# Patient Record
Sex: Female | Born: 1965 | Race: White | Hispanic: No | Smoking: Current every day smoker
Health system: Southern US, Community
[De-identification: ages and names within clinical notes are randomized; demographics above are authoritative.]

## PROBLEM LIST (undated history)

## (undated) DIAGNOSIS — G43809 Other migraine, not intractable, without status migrainosus: Secondary | ICD-10-CM

## (undated) DIAGNOSIS — G43109 Migraine with aura, not intractable, without status migrainosus: Secondary | ICD-10-CM

## (undated) DIAGNOSIS — M542 Cervicalgia: Secondary | ICD-10-CM

## (undated) DIAGNOSIS — M2550 Pain in unspecified joint: Secondary | ICD-10-CM

## (undated) DIAGNOSIS — K5730 Diverticulosis of large intestine without perforation or abscess without bleeding: Secondary | ICD-10-CM

## (undated) DIAGNOSIS — Z87442 Personal history of urinary calculi: Secondary | ICD-10-CM

## (undated) DIAGNOSIS — G8929 Other chronic pain: Secondary | ICD-10-CM

## (undated) DIAGNOSIS — G43909 Migraine, unspecified, not intractable, without status migrainosus: Secondary | ICD-10-CM

## (undated) DIAGNOSIS — M549 Dorsalgia, unspecified: Secondary | ICD-10-CM

## (undated) HISTORY — PX: SHOULDER SURGERY: SHX246

## (undated) HISTORY — PX: BACK SURGERY: SHX140

## (undated) HISTORY — PX: ABDOMINAL HYSTERECTOMY: SHX81

---

## 2002-06-02 ENCOUNTER — Encounter: Admission: RE | Admit: 2002-06-02 | Discharge: 2002-06-02 | Payer: Self-pay | Admitting: Neurosurgery

## 2002-06-02 ENCOUNTER — Encounter: Payer: Self-pay | Admitting: Neurosurgery

## 2002-07-03 ENCOUNTER — Encounter: Payer: Self-pay | Admitting: Neurosurgery

## 2002-07-03 ENCOUNTER — Inpatient Hospital Stay (HOSPITAL_COMMUNITY): Admission: RE | Admit: 2002-07-03 | Discharge: 2002-07-09 | Payer: Self-pay | Admitting: Neurosurgery

## 2002-07-06 ENCOUNTER — Encounter: Payer: Self-pay | Admitting: Neurosurgery

## 2002-10-20 ENCOUNTER — Encounter: Admission: RE | Admit: 2002-10-20 | Discharge: 2002-10-20 | Payer: Self-pay | Admitting: Neurosurgery

## 2002-10-20 ENCOUNTER — Encounter: Payer: Self-pay | Admitting: Neurosurgery

## 2002-12-11 ENCOUNTER — Encounter: Admission: RE | Admit: 2002-12-11 | Discharge: 2002-12-11 | Payer: Self-pay | Admitting: Neurosurgery

## 2003-10-13 DIAGNOSIS — M542 Cervicalgia: Secondary | ICD-10-CM | POA: Insufficient documentation

## 2003-10-13 DIAGNOSIS — Z981 Arthrodesis status: Secondary | ICD-10-CM | POA: Insufficient documentation

## 2003-10-22 ENCOUNTER — Emergency Department: Payer: Self-pay | Admitting: Unknown Physician Specialty

## 2004-01-20 ENCOUNTER — Emergency Department: Payer: Self-pay | Admitting: Emergency Medicine

## 2004-08-04 ENCOUNTER — Emergency Department: Payer: Self-pay | Admitting: General Practice

## 2004-11-14 ENCOUNTER — Emergency Department: Payer: Self-pay | Admitting: Emergency Medicine

## 2004-11-14 ENCOUNTER — Other Ambulatory Visit: Payer: Self-pay

## 2004-11-15 ENCOUNTER — Ambulatory Visit: Payer: Self-pay | Admitting: Emergency Medicine

## 2004-11-16 ENCOUNTER — Emergency Department: Payer: Self-pay | Admitting: Emergency Medicine

## 2005-06-01 ENCOUNTER — Emergency Department: Payer: Self-pay | Admitting: Emergency Medicine

## 2005-08-28 ENCOUNTER — Emergency Department: Payer: Self-pay | Admitting: Emergency Medicine

## 2005-08-28 ENCOUNTER — Other Ambulatory Visit: Payer: Self-pay

## 2006-12-08 ENCOUNTER — Emergency Department: Payer: Self-pay | Admitting: Emergency Medicine

## 2007-02-12 ENCOUNTER — Ambulatory Visit: Payer: Self-pay | Admitting: Pain Medicine

## 2007-03-11 ENCOUNTER — Encounter: Payer: Self-pay | Admitting: Internal Medicine

## 2007-03-26 ENCOUNTER — Ambulatory Visit: Payer: Self-pay | Admitting: Pain Medicine

## 2007-04-02 ENCOUNTER — Ambulatory Visit: Payer: Self-pay | Admitting: Pain Medicine

## 2007-04-07 ENCOUNTER — Ambulatory Visit: Payer: Self-pay | Admitting: Pain Medicine

## 2007-04-08 ENCOUNTER — Ambulatory Visit: Payer: Self-pay | Admitting: Pain Medicine

## 2007-04-23 ENCOUNTER — Ambulatory Visit: Payer: Self-pay | Admitting: Pain Medicine

## 2007-05-21 ENCOUNTER — Ambulatory Visit: Payer: Self-pay | Admitting: Pain Medicine

## 2007-06-25 ENCOUNTER — Ambulatory Visit: Payer: Self-pay | Admitting: Physician Assistant

## 2007-07-28 ENCOUNTER — Ambulatory Visit: Payer: Self-pay | Admitting: Pain Medicine

## 2007-07-28 ENCOUNTER — Emergency Department: Payer: Self-pay | Admitting: Emergency Medicine

## 2007-08-04 ENCOUNTER — Ambulatory Visit: Payer: Self-pay | Admitting: Cardiovascular Disease

## 2007-08-14 ENCOUNTER — Ambulatory Visit: Payer: Self-pay | Admitting: Internal Medicine

## 2007-09-02 ENCOUNTER — Ambulatory Visit: Payer: Self-pay | Admitting: Physician Assistant

## 2007-09-18 ENCOUNTER — Encounter: Payer: Self-pay | Admitting: Pain Medicine

## 2007-10-02 ENCOUNTER — Ambulatory Visit: Payer: Self-pay | Admitting: Physician Assistant

## 2007-12-23 ENCOUNTER — Ambulatory Visit: Payer: Self-pay | Admitting: Pain Medicine

## 2008-02-25 ENCOUNTER — Ambulatory Visit: Payer: Self-pay | Admitting: Physician Assistant

## 2010-02-20 ENCOUNTER — Emergency Department: Payer: Self-pay | Admitting: Emergency Medicine

## 2011-08-21 ENCOUNTER — Ambulatory Visit: Payer: Self-pay | Admitting: Internal Medicine

## 2012-01-19 ENCOUNTER — Emergency Department: Payer: Self-pay | Admitting: Emergency Medicine

## 2012-01-22 DIAGNOSIS — IMO0002 Reserved for concepts with insufficient information to code with codable children: Secondary | ICD-10-CM | POA: Insufficient documentation

## 2012-07-15 ENCOUNTER — Ambulatory Visit: Payer: Self-pay | Admitting: Internal Medicine

## 2012-08-15 ENCOUNTER — Emergency Department: Payer: Self-pay | Admitting: Emergency Medicine

## 2012-08-18 ENCOUNTER — Emergency Department: Payer: Self-pay | Admitting: Emergency Medicine

## 2012-11-03 ENCOUNTER — Ambulatory Visit: Payer: Self-pay | Admitting: Otolaryngology

## 2012-12-18 ENCOUNTER — Ambulatory Visit: Payer: Self-pay | Admitting: Orthopedic Surgery

## 2013-10-27 ENCOUNTER — Emergency Department: Payer: Self-pay | Admitting: Emergency Medicine

## 2013-10-27 LAB — CBC
HCT: 41.5 % (ref 35.0–47.0)
HGB: 13.8 g/dL (ref 12.0–16.0)
MCH: 31.9 pg (ref 26.0–34.0)
MCHC: 33.2 g/dL (ref 32.0–36.0)
MCV: 96 fL (ref 80–100)
Platelet: 242 10*3/uL (ref 150–440)
RBC: 4.32 10*6/uL (ref 3.80–5.20)
RDW: 12.7 % (ref 11.5–14.5)
WBC: 7.8 10*3/uL (ref 3.6–11.0)

## 2013-10-27 LAB — BASIC METABOLIC PANEL
Anion Gap: 7 (ref 7–16)
BUN: 9 mg/dL (ref 7–18)
CALCIUM: 8.4 mg/dL — AB (ref 8.5–10.1)
Chloride: 111 mmol/L — ABNORMAL HIGH (ref 98–107)
Co2: 23 mmol/L (ref 21–32)
Creatinine: 0.96 mg/dL (ref 0.60–1.30)
EGFR (Non-African Amer.): >60
Glucose: 125 mg/dL — ABNORMAL HIGH (ref 65–99)
Osmolality: 281 (ref 275–301)
POTASSIUM: 4 mmol/L (ref 3.5–5.1)
Sodium: 141 mmol/L (ref 136–145)

## 2013-10-27 LAB — TROPONIN I: Troponin-I: <0.02 ng/mL

## 2014-09-14 ENCOUNTER — Emergency Department
Admission: EM | Admit: 2014-09-14 | Discharge: 2014-09-14 | Disposition: A | Discharge status: Home / Self Care | Payer: Medicare Other | Attending: Emergency Medicine | Admitting: Emergency Medicine

## 2014-09-14 ENCOUNTER — Encounter: Payer: Self-pay | Admitting: Emergency Medicine

## 2014-09-14 DIAGNOSIS — Z79899 Other long term (current) drug therapy: Secondary | ICD-10-CM | POA: Diagnosis not present

## 2014-09-14 DIAGNOSIS — M7918 Myalgia, other site: Secondary | ICD-10-CM

## 2014-09-14 DIAGNOSIS — Z791 Long term (current) use of non-steroidal anti-inflammatories (NSAID): Secondary | ICD-10-CM | POA: Insufficient documentation

## 2014-09-14 DIAGNOSIS — Z720 Tobacco use: Secondary | ICD-10-CM | POA: Insufficient documentation

## 2014-09-14 DIAGNOSIS — M546 Pain in thoracic spine: Secondary | ICD-10-CM | POA: Diagnosis not present

## 2014-09-14 DIAGNOSIS — R109 Unspecified abdominal pain: Secondary | ICD-10-CM | POA: Diagnosis not present

## 2014-09-14 DIAGNOSIS — G8929 Other chronic pain: Secondary | ICD-10-CM | POA: Diagnosis not present

## 2014-09-14 HISTORY — DX: Diverticulosis of large intestine without perforation or abscess without bleeding: K57.30

## 2014-09-14 LAB — URINALYSIS COMPLETE WITH MICROSCOPIC (ARMC ONLY)
Bilirubin Urine: NEGATIVE
Glucose, UA: NEGATIVE mg/dL
HGB URINE DIPSTICK: NEGATIVE
Ketones, ur: NEGATIVE mg/dL
LEUKOCYTES UA: NEGATIVE
Nitrite: NEGATIVE
PROTEIN: NEGATIVE mg/dL
RBC / HPF: NONE SEEN RBC/hpf (ref 0–5)
SPECIFIC GRAVITY, URINE: 1.015 (ref 1.005–1.030)
pH: 6 (ref 5.0–8.0)

## 2014-09-14 LAB — CBC WITH DIFFERENTIAL/PLATELET
BASOS ABS: 0.1 10*3/uL (ref 0–0.1)
BASOS PCT: 1 %
EOS ABS: 0 10*3/uL (ref 0–0.7)
EOS PCT: 0 %
HCT: 42.2 % (ref 35.0–47.0)
Hemoglobin: 14.3 g/dL (ref 12.0–16.0)
Lymphocytes Relative: 46 %
Lymphs Abs: 3 10*3/uL (ref 1.0–3.6)
MCH: 31.8 pg (ref 26.0–34.0)
MCHC: 33.8 g/dL (ref 32.0–36.0)
MCV: 94.3 fL (ref 80.0–100.0)
Monocytes Absolute: 0.4 10*3/uL (ref 0.2–0.9)
Monocytes Relative: 6 %
Neutro Abs: 3.1 10*3/uL (ref 1.4–6.5)
Neutrophils Relative %: 47 %
PLATELETS: 270 10*3/uL (ref 150–440)
RBC: 4.48 MIL/uL (ref 3.80–5.20)
RDW: 12.8 % (ref 11.5–14.5)
WBC: 6.5 10*3/uL (ref 3.6–11.0)

## 2014-09-14 LAB — COMPREHENSIVE METABOLIC PANEL
ALBUMIN: 4.1 g/dL (ref 3.5–5.0)
ALK PHOS: 43 U/L (ref 38–126)
ALT: 9 U/L — AB (ref 14–54)
ANION GAP: 6 (ref 5–15)
AST: 16 U/L (ref 15–41)
BILIRUBIN TOTAL: 0.7 mg/dL (ref 0.3–1.2)
BUN: 14 mg/dL (ref 6–20)
CALCIUM: 9.3 mg/dL (ref 8.9–10.3)
CO2: 24 mmol/L (ref 22–32)
Chloride: 109 mmol/L (ref 101–111)
Creatinine, Ser: 1.02 mg/dL — ABNORMAL HIGH (ref 0.44–1.00)
GFR calc Af Amer: >60 mL/min (ref >60)
GFR calc non Af Amer: >60 mL/min (ref >60)
GLUCOSE: 117 mg/dL — AB (ref 65–99)
Potassium: 3.9 mmol/L (ref 3.5–5.1)
Sodium: 139 mmol/L (ref 135–145)
Total Protein: 6.7 g/dL (ref 6.5–8.1)

## 2014-09-14 LAB — LIPASE, BLOOD: LIPASE: 18 U/L — AB (ref 22–51)

## 2014-09-14 MED ORDER — NAPROXEN 250 MG PO TABS: 250.0000 mg | ORAL_TABLET | Freq: Two times a day (BID) | ORAL | Status: DC

## 2014-09-14 NOTE — ED Notes (Signed)
AAOx3.  Skin warm and dry.  NAD.  Ambulates with easy and steaDY gait.  Posture relaxed.  Moving all extremities.

## 2014-09-14 NOTE — ED Notes (Signed)
AAOx3.  Skin warm and dry.  NAD 

## 2014-09-14 NOTE — ED Provider Notes (Signed)
[facility] Emergency Department Provider Note  ____________________________________________  Time seen: 12:20 PM  I have reviewed the triage vital signs and the nursing notes.   HISTORY  Chief Complaint Abdominal Pain    HPI Olivia Young is a 49 y.o. female who complains of right mid back pain that started at 3:00 AM last night. It woke her up from sleep.  It feels similar to her usual chronic muscular skeletal pains, but was not relieved with her home medications including Valium and Norco. She did note that her autistic daughter was home this weekend and she was helping her with assistance from her chair and with bathing, and the daughter was about 220 pounds so she may have strained and aggravated her back. She has a history of back surgery and bulging disks.  No numbness tingling weakness. No bowel or bladder retention or incontinence. No fevers chills nausea vomiting. No chest pain shortness breath.  States that she is now starting to feel better. She rates the pain as 5 out of 10 currently. It is aching, radiating to the right lower back. No aggravating or alleviating factors. No other associated symptoms. No recent dysuria frequency urgency. No vaginal bleeding or discharge.     Past Medical History  Diagnosis Date  . Diverticula, colon    GERD   There are no active problems to display for this patient.    Past Surgical History  Procedure Laterality Date  . Back surgery    . Shoulder surgery     hysterectomy Cholecystectomy  Current Outpatient Rx  Name  Route  Sig  Dispense  Refill  . amphetamine-dextroamphetamine (ADDERALL) 20 MG tablet   Oral   Take 20 mg by mouth 2 (two) times daily.         Marland Kitchen buPROPion (WELLBUTRIN SR) 200 MG 12 hr tablet   Oral   Take 200 mg by mouth 2 (two) times daily.         . diazepam (VALIUM) 10 MG tablet   Oral   Take 10 mg by mouth 4 (four) times daily.         Marland Kitchen esomeprazole (NEXIUM) 40 MG  capsule   Oral   Take 40 mg by mouth daily at 12 noon.         Marland Kitchen HYDROcodone-acetaminophen (NORCO) 7.5-325 MG per tablet   Oral   Take 1 tablet by mouth every 6 (six) hours as needed for moderate pain.         . ranitidine (ZANTAC) 150 MG tablet   Oral   Take 150 mg by mouth every morning.         . simvastatin (ZOCOR) 20 MG tablet   Oral   Take 20 mg by mouth at bedtime.         . topiramate (TOPAMAX) 50 MG tablet   Oral   Take 50 mg by mouth daily.         Marland Kitchen zolpidem (AMBIEN) 10 MG tablet   Oral   Take 10 mg by mouth at bedtime.         . naproxen (NAPROSYN) 250 MG tablet   Oral   Take 1 tablet (250 mg total) by mouth 2 (two) times daily with a meal.   40 tablet   0      Allergies Codeine   History reviewed. No pertinent family history.  Social History Social History  Substance Use Topics  . Smoking status: Current Every Day Smoker  .  Smokeless tobacco: None  . Alcohol Use: No    Review of Systems  Constitutional:   No fever or chills. No weight changes Eyes:   No blurry vision or double vision.  ENT:   No sore throat. Cardiovascular:   No chest pain. Respiratory:   No dyspnea or cough. Gastrointestinal:   Negative for abdominal pain, vomiting and diarrhea.  No BRBPR or melena. Genitourinary:   Negative for dysuria, urinary retention, bloody urine, or difficulty urinating. Musculoskeletal:   Acute on chronic back pain. No joint pains or swelling. Skin:   Negative for rash. Neurological:   Negative for headaches, focal weakness or numbness. Psychiatric:  No anxiety or depression.   Endocrine:  No hot/cold intolerance, changes in energy, or sleep difficulty.  10-point ROS otherwise negative.  ____________________________________________   PHYSICAL EXAM:  VITAL SIGNS: ED Triage Vitals  Enc Vitals Group     BP 09/14/14 0828 99/58 mmHg     Pulse Rate 09/14/14 0828 103     Resp 09/14/14 0828 20     Temp 09/14/14 0828 98.6 F (37 C)      Temp Source 09/14/14 0828 Oral     SpO2 09/14/14 0828 97 %     Weight 09/14/14 0828 132 lb (59.875 kg)     Height 09/14/14 0828 5\' 5"  (1.651 m)     Head Cir --      Peak Flow --      Pain Score 09/14/14 0828 8     Pain Loc --      Pain Edu? --      Excl. in GC? --      Constitutional:   Alert and oriented. Well appearing and in no distress. Eyes:   No scleral icterus. No conjunctival pallor. PERRL. EOMI ENT   Head:   Normocephalic and atraumatic.   Nose:   No congestion/rhinnorhea. No septal hematoma   Mouth/Throat:   MMM, no pharyngeal erythema. No peritonsillar mass. No uvula shift.   Neck:   No stridor. No SubQ emphysema. No meningismus. Hematological/Lymphatic/Immunilogical:   No cervical lymphadenopathy. Cardiovascular:   RRR. Normal and symmetric distal pulses are present in all extremities. No murmurs, rubs, or gallops. Respiratory:   Normal respiratory effort without tachypnea nor retractions. Breath sounds are clear and equal bilaterally. No wheezes/rales/rhonchi. Gastrointestinal:   Soft and nontender. No distention. There is no CVA tenderness.  No rebound, rigidity, or guarding. Genitourinary:   deferred Musculoskeletal:   Nontender with normal range of motion in all extremities. No joint effusions.  No lower extremity tenderness.  No edema. No midline spinal tenderness. There is tenderness in the paraspinous musculature on the right side just below the inferior ribs. Neurologic:   Normal speech and language.  CN 2-10 normal. Motor grossly intact. No pronator drift.  Normal gait. No gross focal neurologic deficits are appreciated.  Skin:    Skin is warm, dry and intact. No rash noted.  No petechiae, purpura, or bullae. Psychiatric:   Mood and affect are normal. Speech and behavior are normal. Patient exhibits appropriate insight and judgment.  ____________________________________________    LABS (pertinent positives/negatives) (all labs ordered are  listed, but only abnormal results are displayed) Labs Reviewed  COMPREHENSIVE METABOLIC PANEL - Abnormal; Notable for the following:    Glucose, Bld 117 (*)    Creatinine, Ser 1.02 (*)    ALT 9 (*)    All other components within normal limits  URINALYSIS COMPLETEWITH MICROSCOPIC ([facility] ONLY) - Abnormal; Notable for the  following:    Color, Urine YELLOW (*)    APPearance CLEAR (*)    Bacteria, UA RARE (*)    Squamous Epithelial / LPF 0-5 (*)    All other components within normal limits  LIPASE, BLOOD - Abnormal; Notable for the following:    Lipase 18 (*)    All other components within normal limits  CBC WITH DIFFERENTIAL/PLATELET   ____________________________________________   EKG    ____________________________________________    RADIOLOGY    ____________________________________________    PROCEDURES   ____________________________________________   INITIAL IMPRESSION / ASSESSMENT AND PLAN / ED COURSE  Pertinent labs & imaging results that were available during my care of the patient were reviewed by me and considered in my medical decision making (see chart for details).  Patient presents with acute on chronic muscular skeletal back pain. Very well-appearing no acute distress. Low suspicion for AAA or abdominal pathology. Low suspicion for torsion or hemorrhage or infectious pathology. No evidence of pyelonephritis or kidney stone.  Continue current therapy, NSAIDs, heating pad, follow with primary care     ____________________________________________   FINAL CLINICAL IMPRESSION(S) / ED DIAGNOSES  Final diagnoses:  Musculoskeletal pain      Sharman Cheek, MD 09/14/14 1244

## 2014-09-14 NOTE — Discharge Instructions (Signed)

## 2014-09-14 NOTE — ED Notes (Signed)
Pt reports right abd pain and flank pain onset yesterday

## 2015-01-26 ENCOUNTER — Other Ambulatory Visit: Payer: Self-pay | Admitting: Neurology

## 2015-01-26 DIAGNOSIS — R1310 Dysphagia, unspecified: Secondary | ICD-10-CM

## 2015-02-07 DIAGNOSIS — G43809 Other migraine, not intractable, without status migrainosus: Secondary | ICD-10-CM | POA: Insufficient documentation

## 2015-02-07 DIAGNOSIS — G43109 Migraine with aura, not intractable, without status migrainosus: Secondary | ICD-10-CM

## 2015-02-15 ENCOUNTER — Ambulatory Visit: Payer: Medicare Other

## 2015-03-09 ENCOUNTER — Ambulatory Visit: Payer: Medicare Other | Attending: Neurology

## 2015-06-08 ENCOUNTER — Ambulatory Visit
Admission: RE | Admit: 2015-06-08 | Discharge: 2015-06-08 | Disposition: A | Discharge status: Home / Self Care | Payer: Medicare Other | Source: Ambulatory Visit | Attending: Neurology | Admitting: Neurology

## 2015-06-08 DIAGNOSIS — R200 Anesthesia of skin: Secondary | ICD-10-CM | POA: Insufficient documentation

## 2015-06-08 DIAGNOSIS — R9089 Other abnormal findings on diagnostic imaging of central nervous system: Secondary | ICD-10-CM | POA: Diagnosis not present

## 2015-06-08 DIAGNOSIS — R1310 Dysphagia, unspecified: Secondary | ICD-10-CM | POA: Diagnosis present

## 2015-06-08 MED ORDER — GADOBENATE DIMEGLUMINE 529 MG/ML IV SOLN
15.0000 mL | Freq: Once | INTRAVENOUS | Status: AC | PRN
  Administered 2015-06-08: 12 mL via INTRAVENOUS

## 2015-07-18 ENCOUNTER — Other Ambulatory Visit: Payer: Self-pay | Admitting: Internal Medicine

## 2015-07-18 DIAGNOSIS — Z1231 Encounter for screening mammogram for malignant neoplasm of breast: Secondary | ICD-10-CM

## 2015-08-05 ENCOUNTER — Ambulatory Visit: Payer: Medicare Other

## 2015-08-11 ENCOUNTER — Ambulatory Visit
Admission: RE | Admit: 2015-08-11 | Discharge: 2015-08-11 | Disposition: A | Discharge status: Home / Self Care | Payer: Medicare Other | Source: Ambulatory Visit | Attending: Internal Medicine | Admitting: Internal Medicine

## 2015-08-11 ENCOUNTER — Other Ambulatory Visit: Payer: Self-pay | Admitting: Internal Medicine

## 2015-08-11 DIAGNOSIS — Z1231 Encounter for screening mammogram for malignant neoplasm of breast: Secondary | ICD-10-CM | POA: Diagnosis not present

## 2015-08-19 ENCOUNTER — Ambulatory Visit: Payer: Medicare Other

## 2015-11-16 ENCOUNTER — Other Ambulatory Visit: Payer: Self-pay | Admitting: Neurological Surgery

## 2015-11-16 DIAGNOSIS — G8929 Other chronic pain: Secondary | ICD-10-CM

## 2015-11-16 DIAGNOSIS — M5441 Lumbago with sciatica, right side: Principal | ICD-10-CM

## 2015-11-16 DIAGNOSIS — M5010 Cervical disc disorder with radiculopathy, unspecified cervical region: Secondary | ICD-10-CM

## 2015-11-16 DIAGNOSIS — M4802 Spinal stenosis, cervical region: Secondary | ICD-10-CM

## 2015-11-16 DIAGNOSIS — M5442 Lumbago with sciatica, left side: Principal | ICD-10-CM

## 2015-11-29 ENCOUNTER — Ambulatory Visit
Admission: RE | Admit: 2015-11-29 | Discharge: 2015-11-29 | Disposition: A | Discharge status: Home / Self Care | Payer: Medicare Other | Source: Ambulatory Visit | Attending: Neurological Surgery | Admitting: Neurological Surgery

## 2015-11-29 DIAGNOSIS — M5441 Lumbago with sciatica, right side: Secondary | ICD-10-CM | POA: Insufficient documentation

## 2015-11-29 DIAGNOSIS — G8929 Other chronic pain: Secondary | ICD-10-CM | POA: Diagnosis not present

## 2015-11-29 DIAGNOSIS — M47896 Other spondylosis, lumbar region: Secondary | ICD-10-CM | POA: Insufficient documentation

## 2015-11-29 DIAGNOSIS — M4802 Spinal stenosis, cervical region: Secondary | ICD-10-CM | POA: Insufficient documentation

## 2015-11-29 DIAGNOSIS — M50221 Other cervical disc displacement at C4-C5 level: Secondary | ICD-10-CM | POA: Insufficient documentation

## 2015-11-29 DIAGNOSIS — M5136 Other intervertebral disc degeneration, lumbar region: Secondary | ICD-10-CM | POA: Insufficient documentation

## 2015-11-29 DIAGNOSIS — M5010 Cervical disc disorder with radiculopathy, unspecified cervical region: Secondary | ICD-10-CM

## 2015-11-29 DIAGNOSIS — Z9889 Other specified postprocedural states: Secondary | ICD-10-CM | POA: Diagnosis not present

## 2015-11-29 DIAGNOSIS — M5442 Lumbago with sciatica, left side: Principal | ICD-10-CM

## 2015-11-29 DIAGNOSIS — M48061 Spinal stenosis, lumbar region without neurogenic claudication: Secondary | ICD-10-CM | POA: Diagnosis not present

## 2016-01-17 ENCOUNTER — Encounter (INDEPENDENT_AMBULATORY_CARE_PROVIDER_SITE_OTHER): Payer: Self-pay

## 2016-01-17 ENCOUNTER — Ambulatory Visit (INDEPENDENT_AMBULATORY_CARE_PROVIDER_SITE_OTHER): Payer: Medicare Other | Admitting: Vascular Surgery

## 2016-01-17 ENCOUNTER — Encounter (INDEPENDENT_AMBULATORY_CARE_PROVIDER_SITE_OTHER): Payer: Self-pay | Admitting: Vascular Surgery

## 2016-01-17 VITALS — BP 98/60 | HR 86 | Resp 16 | Ht 64.5 in | Wt 129.0 lb

## 2016-01-17 DIAGNOSIS — I83813 Varicose veins of bilateral lower extremities with pain: Secondary | ICD-10-CM | POA: Diagnosis not present

## 2016-01-17 DIAGNOSIS — G8929 Other chronic pain: Secondary | ICD-10-CM | POA: Diagnosis not present

## 2016-01-17 DIAGNOSIS — M5441 Lumbago with sciatica, right side: Secondary | ICD-10-CM

## 2016-01-17 DIAGNOSIS — M79605 Pain in left leg: Secondary | ICD-10-CM

## 2016-01-17 DIAGNOSIS — M545 Low back pain, unspecified: Secondary | ICD-10-CM | POA: Insufficient documentation

## 2016-01-17 DIAGNOSIS — E785 Hyperlipidemia, unspecified: Secondary | ICD-10-CM | POA: Diagnosis not present

## 2016-01-17 DIAGNOSIS — M79604 Pain in right leg: Secondary | ICD-10-CM | POA: Diagnosis not present

## 2016-01-17 DIAGNOSIS — M5442 Lumbago with sciatica, left side: Secondary | ICD-10-CM | POA: Diagnosis not present

## 2016-01-17 DIAGNOSIS — M79609 Pain in unspecified limb: Secondary | ICD-10-CM | POA: Insufficient documentation

## 2016-01-17 DIAGNOSIS — F17210 Nicotine dependence, cigarettes, uncomplicated: Secondary | ICD-10-CM

## 2016-01-17 DIAGNOSIS — F17200 Nicotine dependence, unspecified, uncomplicated: Secondary | ICD-10-CM | POA: Insufficient documentation

## 2016-01-17 NOTE — Assessment & Plan Note (Signed)
Could certainly be contributing to her lower extremity symptoms as well. Has already had back surgery.

## 2016-01-17 NOTE — Assessment & Plan Note (Signed)
We had a discussion for approximately 3-4 minutes regarding the absolute need for smoking cessation due to the deleterious nature of tobacco on the vascular system. We discussed the tobacco use would diminish patency of any intervention, and likely significantly worsen progressio of aneurysmal or atherosclerotic disease. We discussed multiple agents for quitting including replacement therapy or medications to reduce cravings such as Chantix. The patient voices their understanding of the importance of smoking cessation.

## 2016-01-17 NOTE — Assessment & Plan Note (Signed)
See below

## 2016-01-17 NOTE — Assessment & Plan Note (Signed)
Suspect neuropathic pain is contributing, but venous disease may be as well. See treatment plan as below

## 2016-01-17 NOTE — Progress Notes (Signed)
Patient ID: Olivia Young, female   DOB: 1965-04-23, 51 y.o.   MRN: [id number]  Chief Complaint  Patient presents with  . New Evaluation    Varicose veins    HPI Olivia Young is a 51 y.o. female.  I am asked to see the patient by Dr Ellsworth Lennox for evaluation of painful varicose veins.  The patient presents with complaints of symptomatic varicosities of the lower extremities. The patient reports a long standing history of varicosities and they have become painful over time. There was no clear inciting event or causative factor that started the symptoms.  The right leg is more severly affected. The patient elevates the legs for relief. The pain is described as stinging and burning and is associated with numbness and tingling in the lower leg. The symptoms are generally most severe in the evening, particularly when they have been on their feet for long periods of time. Anti-inflammatories and narcotics has been used to try to improve the symptoms with limited success. The patient complains of occasional swelling as an associated symptom. The patient has no previous history of deep venous thrombosis or superficial thrombophlebitis to their knowledge. She does have a history of back surgery and likely has a neuropathic component of her pain is well, but this has not been improved with typical medications for neuropathy.     Past Medical History:  Diagnosis Date  . Diverticula, colon     Past Surgical History:  Procedure Laterality Date  . BACK SURGERY    . SHOULDER SURGERY      Family History  Problem Relation Age of Onset  . Stroke Mother   . Depression Mother   . Heart disease Father   . Breast cancer Neg Hx   No bleeding or clotting disorders  Social History Social History  Substance Use Topics  . Smoking status: Current Every Day Smoker  . Smokeless tobacco: Never Used  . Alcohol use No  No IV drug use  Allergies  Allergen Reactions  . Codeine Nausea Only  .  Acetaminophen-Codeine Nausea Only  . Penicillins Rash    Current Outpatient Prescriptions  Medication Sig Dispense Refill  . albuterol (PROVENTIL HFA;VENTOLIN HFA) 108 (90 Base) MCG/ACT inhaler Inhale into the lungs.    Marland Kitchen amphetamine-dextroamphetamine (ADDERALL) 20 MG tablet Take 20 mg by mouth 2 (two) times daily.    Marland Kitchen buPROPion (WELLBUTRIN SR) 200 MG 12 hr tablet Take 200 mg by mouth 2 (two) times daily.    . diazepam (VALIUM) 10 MG tablet Take 10 mg by mouth 4 (four) times daily.    Marland Kitchen esomeprazole (NEXIUM) 40 MG capsule Take 40 mg by mouth daily at 12 noon.    . fluticasone (FLONASE) 50 MCG/ACT nasal spray Place into the nose.    Marland Kitchen HYDROcodone-acetaminophen (NORCO) 7.5-325 MG per tablet Take 1 tablet by mouth every 6 (six) hours as needed for moderate pain.    . mirtazapine (REMERON) 15 MG tablet take 1/2 tablet by mouth at bedtime for 2 weeks then INCREASE TO 1 TABLET at bedtime if needed    . montelukast (SINGULAIR) 10 MG tablet     . naproxen (NAPROSYN) 250 MG tablet Take 1 tablet (250 mg total) by mouth 2 (two) times daily with a meal. 40 tablet 0  . ranitidine (ZANTAC) 150 MG tablet Take 150 mg by mouth every morning.    . simvastatin (ZOCOR) 20 MG tablet Take 20 mg by mouth at bedtime.    Marland Kitchen  topiramate (TOPAMAX) 50 MG tablet Take 50 mg by mouth daily.    Marland Kitchen. zolpidem (AMBIEN) 10 MG tablet Take 10 mg by mouth at bedtime.     No current facility-administered medications for this visit.       REVIEW OF SYSTEMS (Negative unless checked)  Constitutional: [] Weight loss  [] Fever  [] Chills Cardiac: [] Chest pain   [] Chest pressure   [] Palpitations   [] Shortness of breath when laying flat   [] Shortness of breath at rest   [] Shortness of breath with exertion. Vascular:  [x] Pain in legs with walking   [x] Pain in legs at rest   [] Pain in legs when laying flat   [] Claudication   [] Pain in feet when walking  [] Pain in feet at rest  [] Pain in feet when laying flat   [] History of DVT   [] Phlebitis    [] Swelling in legs   [x] Varicose veins   [] Non-healing ulcers Pulmonary:   [] Uses home oxygen   [] Productive cough   [] Hemoptysis   [] Wheeze  [] COPD   [] Asthma Neurologic:  [] Dizziness  [] Blackouts   [] Seizures   [] History of stroke   [] History of TIA  [] Aphasia   [] Temporary blindness   [] Dysphagia   [] Weakness or numbness in arms   [] Weakness or numbness in legs Musculoskeletal:  [] Arthritis   [] Joint swelling   [] Joint pain   [x] Low back pain Hematologic:  [] Easy bruising  [] Easy bleeding   [] Hypercoagulable state   [] Anemic  [] Hepatitis Gastrointestinal:  [] Blood in stool   [] Vomiting blood  [] Gastroesophageal reflux/heartburn   [] Abdominal pain Genitourinary:  [] Chronic kidney disease   [] Difficult urination  [] Frequent urination  [] Burning with urination   [] Hematuria Skin:  [] Rashes   [] Ulcers   [] Wounds Psychological:  [] History of anxiety   []  History of major depression.    Physical Exam BP 98/60 (BP Location: Right Arm)   Pulse 86   Resp 16   Ht 5' 4.5" (1.638 m)   Wt 129 lb (58.5 kg)   BMI 21.80 kg/m  Gen:  WD/WN, NAD. Appears younger than stated age Head: [place]/AT, No temporalis wasting.  Ear/Nose/Throat: Hearing grossly intact, dentition good Eyes: Sclera non-icteric. Conjunctiva clear Neck: Supple, no nuchal rigidity. Trachea midline Pulmonary:  Good air movement, no use of accessory muscles, respirations not labored.  Cardiac: RRR, No JVD Vascular: Varicosities diffuse and measuring up to 3 mm in the right lower extremity        Varicosities scattered and measuring up to 1-2 mm in the left lower extremity Vessel Right Left  Radial Palpable Palpable  Ulnar Palpable Palpable  Brachial Palpable Palpable  Carotid Palpable, without bruit Palpable, without bruit  Aorta Not palpable N/A  Femoral Palpable Palpable  Popliteal Palpable Palpable  PT Palpable Palpable  DP Palpable Palpable   Gastrointestinal: soft, non-tender/non-distended. No guarding/reflex. No masses,  surgical incisions, or scars. Musculoskeletal: M/S 5/5 throughout.   No LE edema Neurologic: Sensation grossly intact in extremities.  Symmetrical.  Speech is fluent.  Psychiatric: Judgment intact, Mood & affect appropriate for pt's clinical situation. Dermatologic: No rashes or ulcers noted.  No cellulitis or open wounds. Lymph : No Cervical, Axillary, or Inguinal lymphadenopathy.   Radiology No results found.  Labs No results found for this or any previous visit (from the past 2160 hour(s)).  Assessment/Plan:  Tobacco use disorder We had a discussion for approximately 3-4 minutes regarding the absolute need for smoking cessation due to the deleterious nature of tobacco on the vascular system. We discussed the tobacco  use would diminish patency of any intervention, and likely significantly worsen progressio of aneurysmal or atherosclerotic disease. We discussed multiple agents for quitting including replacement therapy or medications to reduce cravings such as Chantix. The patient voices their understanding of the importance of smoking cessation.   Low back pain Could certainly be contributing to her lower extremity symptoms as well. Has already had back surgery.  Hyperlipidemia lipid control important in reducing the progression of atherosclerotic disease. Continue statin therapy   Pain in limb Suspect neuropathic pain is contributing, but venous disease may be as well. See treatment plan as below  Varicose veins of leg with pain, bilateral See below    The patient has symptoms consistent with chronic venous insufficiency. We discussed the natural history and treatment options for venous disease. I recommended the regular use of 20 - 30 mm Hg compression stockings, and prescribed these today. I recommended leg elevation and anti-inflammatories as needed for pain. I have also recommended a complete venous duplex to assess the venous system for reflux or thrombotic issues. This can  be done at the patient's convenience. I will see the patient back in 3 months to assess the response to conservative management, and determine further treatment options.     Festus Barren 01/17/2016, 12:03 PM   This note was created with Dragon medical transcription system.  Any errors from dictation are unintentional.

## 2016-01-17 NOTE — Patient Instructions (Signed)
Venous Stasis or Chronic Venous Insufficiency Chronic venous insufficiency, also called venous stasis, is a condition that affects the veins in the legs. The condition prevents blood from being pumped through these veins effectively. Blood may no longer be pumped effectively from the legs back to the heart. This condition can range from mild to severe. With proper treatment, you should be able to continue with an active life. CAUSES  Chronic venous insufficiency occurs when the vein walls become stretched, weakened, or damaged or when valves within the vein are damaged. Some common causes of this include:  High blood pressure inside the veins (venous hypertension).  Increased blood pressure in the leg veins from long periods of sitting or standing.  A blood clot that blocks blood flow in a vein (deep vein thrombosis).  Inflammation of a superficial vein (phlebitis) that causes a blood clot to form. RISK FACTORS Various things can make you more likely to develop chronic venous insufficiency, including:  Family history of this condition.  Obesity.  Pregnancy.  Sedentary lifestyle.  Smoking.  Jobs requiring long periods of standing or sitting in one place.  Being a certain age. Women in their 40s and 50s and men in their 70s are more likely to develop this condition. SIGNS AND SYMPTOMS  Symptoms may include:   Varicose veins.  Skin breakdown or ulcers.  Reddened or discolored skin on the leg.  Brown, smooth, tight, and painful skin just above the ankle, usually on the inside surface (lipodermatosclerosis).  Swelling. DIAGNOSIS  To diagnose this condition, your health care provider will take a medical history and do a physical exam. The following tests may be ordered to confirm the diagnosis:  Duplex ultrasound-A procedure that produces a picture of a blood vessel and nearby organs and also provides information on blood flow through the blood vessel.  Plethysmography-A  procedure that tests blood flow.  A venogram, or venography-A procedure used to look at the veins using X-ray and dye. TREATMENT The goals of treatment are to help you return to an active life and to minimize pain or disability. Treatment will depend on the severity of the condition. Medical procedures may be needed for severe cases. Treatment options may include:   Use of compression stockings. These can help with symptoms and lower the chances of the problem getting worse, but they do not cure the problem.  Sclerotherapy-A procedure involving an injection of a material that "dissolves" the damaged veins. Other veins in the network of blood vessels take over the function of the damaged veins.  Surgery to remove the vein or cut off blood flow through the vein (vein stripping or laser ablation surgery).  Surgery to repair a valve. HOME CARE INSTRUCTIONS   Wear compression stockings as directed by your health care provider.  Only take over-the-counter or prescription medicines for pain, discomfort, or fever as directed by your health care provider.  Follow up with your health care provider as directed. SEEK MEDICAL CARE IF:   You have redness, swelling, or increasing pain in the affected area.  You see a red streak or line that extends up or down from the affected area.  You have a breakdown or loss of skin in the affected area, even if the breakdown is small.  You have an injury to the affected area. SEEK IMMEDIATE MEDICAL CARE IF:   You have an injury and open wound in the affected area.  Your pain is severe and does not improve with medicine.  You have   sudden numbness or weakness in the foot or ankle below the affected area, or you have trouble moving your foot or ankle.  You have a fever or persistent symptoms for more than 2-3 days.  You have a fever and your symptoms suddenly get worse. MAKE SURE YOU:   Understand these instructions.  Will watch your condition.  Will  get help right away if you are not doing well or get worse. This information is not intended to replace advice given to you by your health care provider. Make sure you discuss any questions you have with your health care provider. Document Released: 04/30/2006 Document Revised: 10/15/2012 Document Reviewed: 09/01/2012 Elsevier Interactive Patient Education  2017 [employer]  

## 2016-01-17 NOTE — Assessment & Plan Note (Signed)
lipid control important in reducing the progression of atherosclerotic disease. Continue statin therapy  

## 2016-02-20 ENCOUNTER — Encounter (INDEPENDENT_AMBULATORY_CARE_PROVIDER_SITE_OTHER): Payer: Medicare Other

## 2016-02-20 ENCOUNTER — Ambulatory Visit (INDEPENDENT_AMBULATORY_CARE_PROVIDER_SITE_OTHER): Payer: Medicare Other | Admitting: Vascular Surgery

## 2016-04-12 ENCOUNTER — Ambulatory Visit (INDEPENDENT_AMBULATORY_CARE_PROVIDER_SITE_OTHER): Payer: Medicare Other

## 2016-04-12 ENCOUNTER — Ambulatory Visit (INDEPENDENT_AMBULATORY_CARE_PROVIDER_SITE_OTHER): Payer: Medicare Other | Admitting: Vascular Surgery

## 2016-04-12 ENCOUNTER — Encounter (INDEPENDENT_AMBULATORY_CARE_PROVIDER_SITE_OTHER): Payer: Self-pay | Admitting: Vascular Surgery

## 2016-04-12 ENCOUNTER — Encounter (INDEPENDENT_AMBULATORY_CARE_PROVIDER_SITE_OTHER): Payer: Self-pay

## 2016-04-12 VITALS — BP 113/68 | HR 93 | Resp 16 | Ht 64.0 in | Wt 130.0 lb

## 2016-04-12 DIAGNOSIS — I872 Venous insufficiency (chronic) (peripheral): Secondary | ICD-10-CM | POA: Diagnosis not present

## 2016-04-12 DIAGNOSIS — I83813 Varicose veins of bilateral lower extremities with pain: Secondary | ICD-10-CM

## 2016-04-12 NOTE — Progress Notes (Signed)
Subjective:    Patient ID: Olivia Young, female    DOB: 15-Sep-1965, 51 y.o.   MRN: [id number] Chief Complaint  Patient presents with  . Re-evaluation    Ultrasound follow up   Patient last seen on 01/27/16 for evaluation of bilateral lower extremity symptomatic varicose veins. The patient relates burning and stinging which worsens steadily throughout the course of the day, particularly with standing. The patient also notes an aching and throbbing pain over the varicosities, particularly with prolonged dependent positions. The patient also notes that during hot weather the symptoms are greatly intensified. Since her initial visit, the patient has been wearing medical grade one compression, elevating her legs and remaining active with minimal relief in symptoms requiring the use of OTC anti-inflammatories. The patient states the pain from the varicose veins interferes with work, daily exercise, shopping and household maintenance. At this point, the symptoms are persistent and severe enough that they're having a negative impact on lifestyle and are interfering with daily activities. The patient underwent a bilateral venous duplex which was notable for venous incompetence of the bilateral great saphenous veins. No DVT or SVT.   Review of Systems  Constitutional: Negative.   HENT: Negative.   Eyes: Negative.   Respiratory: Negative.   Cardiovascular: Positive for leg swelling.       Painful varicose veins.  Gastrointestinal: Negative.   Endocrine: Negative.   Genitourinary: Negative.   Musculoskeletal: Negative.   Skin: Negative.   Allergic/Immunologic: Negative.   Neurological: Negative.   Hematological: Negative.   Psychiatric/Behavioral: Negative.       Objective:   Physical Exam  Constitutional: She is oriented to person, place, and time. She appears well-developed and well-nourished. No distress.  HENT:  Head: Normocephalic and atraumatic.  Eyes: Conjunctivae are normal. Pupils  are equal, round, and reactive to light.  Neck: Normal range of motion.  Cardiovascular: Normal rate, regular rhythm, normal heart sounds and intact distal pulses.   Pulses:      Radial pulses are 2+ on the right side, and 2+ on the left side.       Dorsalis pedis pulses are 2+ on the right side, and 2+ on the left side.       Posterior tibial pulses are 2+ on the right side, and 2+ on the left side.  Pulmonary/Chest: Effort normal.  Musculoskeletal: Normal range of motion. She exhibits edema (Mild Bilateral Edema).  Neurological: She is alert and oriented to person, place, and time.  Skin: Skin is warm and dry. She is not diaphoretic.  Mixed >1cm & <1cm scattered varicose veins bilaterally.   Psychiatric: She has a normal mood and affect. Her behavior is normal. Judgment and thought content normal.  Vitals reviewed.  BP 113/68 (BP Location: Right Arm)   Pulse 93   Resp 16   Ht  (1.626 m)   Wt 130 lb (59 kg)   BMI 22.31 kg/m   Past Medical History:  Diagnosis Date  . Diverticula, colon    Social History   Social History  . Marital status: Divorced    Spouse name: N/A  . Number of children: N/A  . Years of education: N/A   Occupational History  . Not on file.   Social History Main Topics  . Smoking status: Current Every Day Smoker  . Smokeless tobacco: Never Used  . Alcohol use No  . Drug use: Unknown  . Sexual activity: Not on file   Other Topics Concern  .  Not on file   Social History Narrative  . No narrative on file   Past Surgical History:  Procedure Laterality Date  . BACK SURGERY    . SHOULDER SURGERY     Family History  Problem Relation Age of Onset  . Stroke Mother   . Depression Mother   . Heart disease Father   . Breast cancer Neg Hx    Allergies  Allergen Reactions  . Codeine Nausea Only  . Acetaminophen-Codeine Nausea Only  . Penicillins Rash      Assessment & Plan:  Patient last seen on 01/27/16 for evaluation of bilateral lower  extremity symptomatic varicose veins. The patient relates burning and stinging which worsens steadily throughout the course of the day, particularly with standing. The patient also notes an aching and throbbing pain over the varicosities, particularly with prolonged dependent positions. The patient also notes that during hot weather the symptoms are greatly intensified. Since her initial visit, the patient has been wearing medical grade one compression, elevating her legs and remaining active with minimal relief in symptoms requiring the use of OTC anti-inflammatories. The patient states the pain from the varicose veins interferes with work, daily exercise, shopping and household maintenance. At this point, the symptoms are persistent and severe enough that they're having a negative impact on lifestyle and are interfering with daily activities. The patient underwent a bilateral venous duplex which was notable for venous incompetence of the bilateral great saphenous veins. No DVT or SVT.  1. Chronic venous insufficiency - New Patient has tried conservative therapy her for three month including medical grade one compression, elevating her legs and remaining active with minimal relief in symptoms requiring the use of OTC anti-inflammatories.  The patient states the pain from the varicose veins interferes with work, daily exercise, shopping and household maintenance.  At this point, the symptoms are persistent and severe enough that they're having a negative impact on lifestyle and are interfering with daily activities.  The patient underwent a bilateral venous duplex which was notable for venous incompetence of the bilateral great saphenous veins. No DVT or SVT. The patient is likely to benefit from endovenous laser ablation.  I have discussed the risks and benefits of the procedure. The risks primarily include DVT, recanalization, bleeding, infection, and inability to gain access. Will call patient with  insurance approval.  2. Varicose veins of leg with pain, bilateral - Worsening As above.  Current Outpatient Prescriptions on File Prior to Visit  Medication Sig Dispense Refill  . albuterol (PROVENTIL HFA;VENTOLIN HFA) 108 (90 Base) MCG/ACT inhaler Inhale into the lungs.    Marland Kitchen amphetamine-dextroamphetamine (ADDERALL) 20 MG tablet Take 20 mg by mouth 2 (two) times daily.    Marland Kitchen buPROPion (WELLBUTRIN SR) 200 MG 12 hr tablet Take 200 mg by mouth 2 (two) times daily.    . diazepam (VALIUM) 10 MG tablet Take 10 mg by mouth 4 (four) times daily.    Marland Kitchen esomeprazole (NEXIUM) 40 MG capsule Take 40 mg by mouth daily at 12 noon.    . fluticasone (FLONASE) 50 MCG/ACT nasal spray Place into the nose.    Marland Kitchen HYDROcodone-acetaminophen (NORCO) 7.5-325 MG per tablet Take 1 tablet by mouth every 6 (six) hours as needed for moderate pain.    . mirtazapine (REMERON) 15 MG tablet take 1/2 tablet by mouth at bedtime for 2 weeks then INCREASE TO 1 TABLET at bedtime if needed    . montelukast (SINGULAIR) 10 MG tablet     . naproxen (  NAPROSYN) 250 MG tablet Take 1 tablet (250 mg total) by mouth 2 (two) times daily with a meal. 40 tablet 0  . ranitidine (ZANTAC) 150 MG tablet Take 150 mg by mouth every morning.    . simvastatin (ZOCOR) 20 MG tablet Take 20 mg by mouth at bedtime.    . topiramate (TOPAMAX) 50 MG tablet Take 50 mg by mouth daily.    Marland Kitchen zolpidem (AMBIEN) 10 MG tablet Take 10 mg by mouth at bedtime.     No current facility-administered medications on file prior to visit.     There are no Patient Instructions on file for this visit. No Follow-up on file.   KIMBERLY A STEGMAYER, PA-C

## 2016-04-26 ENCOUNTER — Other Ambulatory Visit: Payer: Self-pay | Admitting: Neurological Surgery

## 2016-04-26 DIAGNOSIS — M47816 Spondylosis without myelopathy or radiculopathy, lumbar region: Secondary | ICD-10-CM

## 2016-04-27 ENCOUNTER — Ambulatory Visit
Admission: RE | Admit: 2016-04-27 | Discharge: 2016-04-27 | Disposition: A | Discharge status: Home / Self Care | Payer: Medicare Other | Source: Ambulatory Visit | Attending: Neurological Surgery | Admitting: Neurological Surgery

## 2016-04-27 ENCOUNTER — Other Ambulatory Visit: Payer: Self-pay | Admitting: Neurological Surgery

## 2016-04-27 DIAGNOSIS — M5410 Radiculopathy, site unspecified: Secondary | ICD-10-CM

## 2016-04-27 DIAGNOSIS — M47816 Spondylosis without myelopathy or radiculopathy, lumbar region: Secondary | ICD-10-CM

## 2016-04-27 DIAGNOSIS — Z981 Arthrodesis status: Secondary | ICD-10-CM

## 2016-04-27 MED ORDER — IOPAMIDOL (ISOVUE-M 200) INJECTION 41%
1.0000 mL | Freq: Once | INTRAMUSCULAR | Status: AC
  Administered 2016-04-27: 1 mL via EPIDURAL

## 2016-04-27 MED ORDER — METHYLPREDNISOLONE ACETATE 40 MG/ML INJ SUSP (RADIOLOG
120.0000 mg | Freq: Once | INTRAMUSCULAR | Status: AC
  Administered 2016-04-27: 120 mg via EPIDURAL

## 2016-04-27 NOTE — Discharge Instructions (Signed)

## 2016-05-18 ENCOUNTER — Encounter (INDEPENDENT_AMBULATORY_CARE_PROVIDER_SITE_OTHER): Payer: Self-pay | Admitting: Vascular Surgery

## 2016-05-18 ENCOUNTER — Ambulatory Visit (INDEPENDENT_AMBULATORY_CARE_PROVIDER_SITE_OTHER): Payer: Medicare Other | Admitting: Vascular Surgery

## 2016-05-18 VITALS — BP 113/68 | HR 75 | Resp 16 | Ht 64.0 in | Wt 140.0 lb

## 2016-05-18 DIAGNOSIS — I83813 Varicose veins of bilateral lower extremities with pain: Secondary | ICD-10-CM

## 2016-05-18 DIAGNOSIS — M79605 Pain in left leg: Secondary | ICD-10-CM | POA: Diagnosis not present

## 2016-05-18 DIAGNOSIS — I83811 Varicose veins of right lower extremities with pain: Secondary | ICD-10-CM

## 2016-05-18 DIAGNOSIS — M79604 Pain in right leg: Secondary | ICD-10-CM

## 2016-05-18 NOTE — Progress Notes (Signed)
Varicose veins of leg with pain, bilateral see laser note   The patient's right lower extremity was sterilely prepped and draped. The ultrasound machine was used to visualize the saphenous vein throughout its course. A segment in the mid calf was selected for access. The saphenous vein was accessed without difficulty using ultrasound guidance with a micro puncture needle. A micro puncture wire and sheath were then placed. A 0.018 wire was placed beyond the saphenofemoral junction through the sheath and the micro puncture sheath was removed. The 65 cm sheath was then placed over the wire and the wire and dilator were removed. The laser fiber was placed through the sheath and its tip was placed approximately 5 cm below the saphenofemoral junction. Tumescent anesthesia was then created with a dilute lidocaine solution. Laser energy was then delivered with constant withdrawal of the sheath and laser fiber. Approximately 1622 Joules of energy were delivered over a length of 44 cm using the 1470 Hz Vena[employer]ure machine at [id number]. Sterile dressings were placed. The patient tolerated the procedure well without complications.

## 2016-05-18 NOTE — Assessment & Plan Note (Signed)
see laser note 

## 2016-05-23 ENCOUNTER — Ambulatory Visit (INDEPENDENT_AMBULATORY_CARE_PROVIDER_SITE_OTHER): Payer: Medicare Other

## 2016-05-23 DIAGNOSIS — I83813 Varicose veins of bilateral lower extremities with pain: Secondary | ICD-10-CM | POA: Diagnosis not present

## 2016-06-12 ENCOUNTER — Ambulatory Visit (INDEPENDENT_AMBULATORY_CARE_PROVIDER_SITE_OTHER): Payer: Medicare Other | Admitting: Vascular Surgery

## 2016-06-12 ENCOUNTER — Encounter (INDEPENDENT_AMBULATORY_CARE_PROVIDER_SITE_OTHER): Payer: Self-pay | Admitting: Vascular Surgery

## 2016-06-12 VITALS — BP 114/70 | HR 88 | Resp 16 | Wt 137.0 lb

## 2016-06-12 DIAGNOSIS — M5442 Lumbago with sciatica, left side: Secondary | ICD-10-CM | POA: Diagnosis not present

## 2016-06-12 DIAGNOSIS — I83813 Varicose veins of bilateral lower extremities with pain: Secondary | ICD-10-CM | POA: Diagnosis not present

## 2016-06-12 DIAGNOSIS — G8929 Other chronic pain: Secondary | ICD-10-CM | POA: Diagnosis not present

## 2016-06-12 DIAGNOSIS — E785 Hyperlipidemia, unspecified: Secondary | ICD-10-CM

## 2016-06-12 DIAGNOSIS — M79604 Pain in right leg: Secondary | ICD-10-CM | POA: Diagnosis not present

## 2016-06-12 DIAGNOSIS — M5441 Lumbago with sciatica, right side: Secondary | ICD-10-CM

## 2016-06-12 DIAGNOSIS — M79605 Pain in left leg: Secondary | ICD-10-CM

## 2016-06-12 NOTE — Progress Notes (Signed)
MRN : [id number]  Olivia Young is a 51 y.o. ([birth year]) female who presents with chief complaint of  Chief Complaint  Patient presents with  . post laser follow up  .  History of Present Illness: Patient returns in follow up for venous insufficiency. She had a right great saphenous vein laser ablation performed about 3-4 weeks ago. She did have more soreness and bruising than she anticipated, but that has largely resolved and she says her leg feels noticeably better. She estimates this to be 50% in terms of improvement. She is still bothered by painful residual varicosities in that right leg, and still is scheduled for left leg laser ablation in the future.         Past Medical History:  Diagnosis Date  . Diverticula, colon          Past Surgical History:  Procedure Laterality Date  . BACK SURGERY    . SHOULDER SURGERY           Family History  Problem Relation Age of Onset  . Stroke Mother   . Depression Mother   . Heart disease Father   . Breast cancer Neg Hx   No bleeding or clotting disorders  Social History     Social History  Substance Use Topics  . Smoking status: Current Every Day Smoker  . Smokeless tobacco: Never Used  . Alcohol use No  No IV drug use      Allergies  Allergen Reactions  . Codeine Nausea Only  . Acetaminophen-Codeine Nausea Only  . Penicillins Rash          Current Outpatient Prescriptions  Medication Sig Dispense Refill  . albuterol (PROVENTIL HFA;VENTOLIN HFA) 108 (90 Base) MCG/ACT inhaler Inhale into the lungs.    Marland Kitchen. amphetamine-dextroamphetamine (ADDERALL) 20 MG tablet Take 20 mg by mouth 2 (two) times daily.    Marland Kitchen. buPROPion (WELLBUTRIN SR) 200 MG 12 hr tablet Take 200 mg by mouth 2 (two) times daily.    . diazepam (VALIUM) 10 MG tablet Take 10 mg by mouth 4 (four) times daily.    Marland Kitchen. esomeprazole (NEXIUM) 40 MG capsule Take 40 mg by mouth daily at 12 noon.    . fluticasone (FLONASE) 50 MCG/ACT  nasal spray Place into the nose.    Marland Kitchen. HYDROcodone-acetaminophen (NORCO) 7.5-325 MG per tablet Take 1 tablet by mouth every 6 (six) hours as needed for moderate pain.    . mirtazapine (REMERON) 15 MG tablet take 1/2 tablet by mouth at bedtime for 2 weeks then INCREASE TO 1 TABLET at bedtime if needed    . montelukast (SINGULAIR) 10 MG tablet     . naproxen (NAPROSYN) 250 MG tablet Take 1 tablet (250 mg total) by mouth 2 (two) times daily with a meal. 40 tablet 0  . ranitidine (ZANTAC) 150 MG tablet Take 150 mg by mouth every morning.    . simvastatin (ZOCOR) 20 MG tablet Take 20 mg by mouth at bedtime.    . topiramate (TOPAMAX) 50 MG tablet Take 50 mg by mouth daily.    Marland Kitchen. zolpidem (AMBIEN) 10 MG tablet Take 10 mg by mouth at bedtime.     No current facility-administered medications for this visit.       REVIEW OF SYSTEMS (Negative unless checked)  Constitutional: [] Weight loss  [] Fever  [] Chills Cardiac: [] Chest pain   [] Chest pressure   [] Palpitations   [] Shortness of breath when laying flat   [] Shortness of breath at rest   []   Shortness of breath with exertion. Vascular:  [x] Pain in legs with walking   [x] Pain in legs at rest   [] Pain in legs when laying flat   [] Claudication   [] Pain in feet when walking  [] Pain in feet at rest  [] Pain in feet when laying flat   [] History of DVT   [] Phlebitis   [] Swelling in legs   [x] Varicose veins   [] Non-healing ulcers Pulmonary:   [] Uses home oxygen   [] Productive cough   [] Hemoptysis   [] Wheeze  [] COPD   [] Asthma Neurologic:  [] Dizziness  [] Blackouts   [] Seizures   [] History of stroke   [] History of TIA  [] Aphasia   [] Temporary blindness   [] Dysphagia   [] Weakness or numbness in arms   [] Weakness or numbness in legs Musculoskeletal:  [] Arthritis   [] Joint swelling   [] Joint pain   [x] Low back pain Hematologic:  [] Easy bruising  [] Easy bleeding   [] Hypercoagulable state   [] Anemic  [] Hepatitis Gastrointestinal:  [] Blood in stool    [] Vomiting blood  [] Gastroesophageal reflux/heartburn   [] Abdominal pain Genitourinary:  [] Chronic kidney disease   [] Difficult urination  [] Frequent urination  [] Burning with urination   [] Hematuria Skin:  [] Rashes   [] Ulcers   [] Wounds Psychological:  [] History of anxiety   []  History of major depression.    Physical Examination  Vitals:   06/12/16 1618  BP: 114/70  Pulse: 88  Resp: 16  Weight: 62.1 kg (137 lb)   Body mass index is 23.52 kg/m. Gen:  WD/WN, NAD, appears younger than stated age Head: [place]/AT, No temporalis wasting. Ear/Nose/Throat: Hearing grossly intact, dentition good, trachea midline Eyes: Conjunctiva clear. Sclera non-icteric Neck: Supple.  No JVD. Trachea midline Pulmonary:  Good air movement, respirations not labored, no use of accessory muscles.  Cardiac: RRR, normal S1, S2. Vascular: diffuse varicosities in both lower extremities.   Vessel Right Left  Radial Palpable Palpable                                   Gastrointestinal: soft, non-tender/non-distended.  Musculoskeletal: M/S 5/5 throughout.  No deformity or atrophy. Mild bilateral edema. Neurologic: Sensation grossly intact in extremities.  Symmetrical.  Speech is fluent. Psychiatric: Judgment intact, Mood & affect appropriate for pt's clinical situation. Dermatologic: No rashes or ulcers noted.  No cellulitis or open wounds.      Labs No results found for this or any previous visit (from the past 2160 hour(s)).  Radiology No results found.    Assessment/Plan Low back pain Could certainly be contributing to her lower extremity symptoms as well. Has already had back surgery.  Hyperlipidemia lipid control important in reducing the progression of atherosclerotic disease. Continue statin therapy   Pain in limb Suspect neuropathic pain is contributing, but venous disease may be as well. See treatment plan as below  Varicose veins of leg with pain, bilateral The patient is  doing well with improvement but not resolution of symptoms after laser ablation of the right great saphenous vein. She is bothered by residual painful varicosities which causes local symptoms of pain and irritation. We discussed the role of sclerotherapy and foam sclerotherapy for treating these residual varicosities. The patient voices their understanding and desires to have this performed. The patient is also planning to have  the left great saphenous vein treated with laser ablation in the future. The right lower extremity was the more severely affected to begin with and was done  first. She is planning to have this done later in the summer.    Festus Barren, MD  06/13/2016 2:02 PM    This note was created with Dragon medical transcription system.  Any errors from dictation are purely unintentional

## 2016-06-13 NOTE — Assessment & Plan Note (Signed)
The patient is doing well with improvement but not resolution of symptoms after laser ablation of the right great saphenous vein. She is bothered by residual painful varicosities which causes local symptoms of pain and irritation. We discussed the role of sclerotherapy and foam sclerotherapy for treating these residual varicosities. The patient voices their understanding and desires to have this performed. The patient is also planning to have  the left great saphenous vein treated with laser ablation in the future. The right lower extremity was the more severely affected to begin with and was done first. She is planning to have this done later in the summer.

## 2016-06-19 ENCOUNTER — Telehealth (INDEPENDENT_AMBULATORY_CARE_PROVIDER_SITE_OTHER): Payer: Self-pay | Admitting: Vascular Surgery

## 2016-06-19 NOTE — Telephone Encounter (Signed)
Patient states that her right leg had been feeling better and improved until recently. She states it is in a lot of pain now. Laser done 05/18/16. She also stated that it could be related to her sciatica. She is not home, but can leave a message on her home phone.

## 2016-06-19 NOTE — Telephone Encounter (Signed)
If she wants, we can repeat a venous duplex to assess if her GSV has recannulated. She should continue to engage in conservative therapy, including wearing medical grade one compression stockings, elevating her lower extremity and remaining active. Please call the patient and give her the option of repeating a duplex to assess the GSV.

## 2016-06-22 NOTE — Telephone Encounter (Signed)
Left message for the patient to give me a call back here at the office so that I can inform her of what to do about her leg. I have not received a phone call back yet.

## 2016-06-26 ENCOUNTER — Ambulatory Visit (INDEPENDENT_AMBULATORY_CARE_PROVIDER_SITE_OTHER): Payer: Medicare Other | Admitting: Vascular Surgery

## 2016-07-13 ENCOUNTER — Encounter (INDEPENDENT_AMBULATORY_CARE_PROVIDER_SITE_OTHER): Payer: Self-pay | Admitting: Vascular Surgery

## 2016-07-13 ENCOUNTER — Ambulatory Visit (INDEPENDENT_AMBULATORY_CARE_PROVIDER_SITE_OTHER): Payer: Medicare Other | Admitting: Vascular Surgery

## 2016-07-13 VITALS — BP 116/69 | HR 68 | Resp 16 | Ht 65.0 in | Wt 135.0 lb

## 2016-07-13 DIAGNOSIS — I83811 Varicose veins of right lower extremities with pain: Secondary | ICD-10-CM | POA: Diagnosis not present

## 2016-07-13 DIAGNOSIS — I83813 Varicose veins of bilateral lower extremities with pain: Secondary | ICD-10-CM

## 2016-07-13 NOTE — Progress Notes (Signed)
Olivia Young is a 51 y.o.female who presents with painful varicose veins of the right leg  Past Medical History:  Diagnosis Date  . Diverticula, colon     Past Surgical History:  Procedure Laterality Date  . BACK SURGERY    . SHOULDER SURGERY      Current Outpatient Prescriptions  Medication Sig Dispense Refill  . albuterol (PROVENTIL HFA;VENTOLIN HFA) 108 (90 Base) MCG/ACT inhaler Inhale into the lungs.    Marland Kitchen. amphetamine-dextroamphetamine (ADDERALL) 20 MG tablet Take 20 mg by mouth 2 (two) times daily.    Marland Kitchen. buPROPion (WELLBUTRIN SR) 200 MG 12 hr tablet Take 200 mg by mouth 2 (two) times daily.    . cyclobenzaprine (FLEXERIL) 5 MG tablet     . diazepam (VALIUM) 10 MG tablet Take 10 mg by mouth 4 (four) times daily.    Marland Kitchen. dicyclomine (BENTYL) 10 MG capsule Take by mouth.    . esomeprazole (NEXIUM) 40 MG capsule Take 40 mg by mouth daily at 12 noon.    . fluticasone (FLONASE) 50 MCG/ACT nasal spray Place into the nose.    Marland Kitchen. HYDROcodone-acetaminophen (NORCO) 7.5-325 MG per tablet Take 1 tablet by mouth every 6 (six) hours as needed for moderate pain.    Marland Kitchen. lubiprostone (AMITIZA) 24 MCG capsule take 1 capsule by mouth every morning and 2 capsules by mouth every evening    . meclizine (ANTIVERT) 25 MG tablet Take by mouth.    . mirtazapine (REMERON) 15 MG tablet take 1/2 tablet by mouth at bedtime for 2 weeks then INCREASE TO 1 TABLET at bedtime if needed    . mirtazapine (REMERON) 30 MG tablet     . montelukast (SINGULAIR) 10 MG tablet     . naproxen (NAPROSYN) 250 MG tablet Take 1 tablet (250 mg total) by mouth 2 (two) times daily with a meal. 40 tablet 0  . ranitidine (ZANTAC) 150 MG tablet Take 150 mg by mouth every morning.    . simvastatin (ZOCOR) 20 MG tablet Take 20 mg by mouth at bedtime.    . topiramate (TOPAMAX) 50 MG tablet Take 50 mg by mouth daily.    Marland Kitchen. zolpidem (AMBIEN) 10 MG tablet Take 10 mg by mouth at bedtime.     No current facility-administered medications for this  visit.     Allergies  Allergen Reactions  . Acetaminophen-Codeine Nausea Only  . Codeine Nausea Only  . Penicillins Rash    Indication: Patient presents with symptomatic varicose veins of the right lower extremity.  Procedure: Foam sclerotherapy was performed on the right lower extremity. Using ultrasound guidance, 5 mL of foam Sotradecol was used to inject the varicosities of the right lower extremity. Compression wraps were placed. The patient tolerated the procedure well.

## 2016-08-07 ENCOUNTER — Ambulatory Visit (INDEPENDENT_AMBULATORY_CARE_PROVIDER_SITE_OTHER): Payer: Medicare Other | Admitting: Vascular Surgery

## 2016-09-11 ENCOUNTER — Other Ambulatory Visit: Payer: Self-pay | Admitting: Neurological Surgery

## 2016-09-11 ENCOUNTER — Ambulatory Visit (INDEPENDENT_AMBULATORY_CARE_PROVIDER_SITE_OTHER): Payer: Medicare Other | Admitting: Vascular Surgery

## 2016-09-11 DIAGNOSIS — M47816 Spondylosis without myelopathy or radiculopathy, lumbar region: Secondary | ICD-10-CM

## 2016-10-18 ENCOUNTER — Other Ambulatory Visit: Payer: Self-pay | Admitting: Neurological Surgery

## 2016-10-18 ENCOUNTER — Ambulatory Visit
Admission: RE | Admit: 2016-10-18 | Discharge: 2016-10-18 | Disposition: A | Discharge status: Home / Self Care | Payer: Medicare Other | Source: Ambulatory Visit | Attending: Neurological Surgery | Admitting: Neurological Surgery

## 2016-10-18 DIAGNOSIS — M47816 Spondylosis without myelopathy or radiculopathy, lumbar region: Secondary | ICD-10-CM

## 2016-10-18 MED ORDER — METHYLPREDNISOLONE ACETATE 40 MG/ML INJ SUSP (RADIOLOG
120.0000 mg | Freq: Once | INTRAMUSCULAR | Status: AC
  Administered 2016-10-18: 120 mg via EPIDURAL

## 2016-10-18 MED ORDER — IOPAMIDOL (ISOVUE-M 200) INJECTION 41%
1.0000 mL | Freq: Once | INTRAMUSCULAR | Status: AC
  Administered 2016-10-18: 1 mL via EPIDURAL

## 2016-10-18 NOTE — Discharge Instructions (Signed)

## 2016-12-18 ENCOUNTER — Ambulatory Visit (INDEPENDENT_AMBULATORY_CARE_PROVIDER_SITE_OTHER): Payer: Medicare Other | Admitting: Vascular Surgery

## 2017-01-15 ENCOUNTER — Ambulatory Visit (INDEPENDENT_AMBULATORY_CARE_PROVIDER_SITE_OTHER): Payer: Medicare Other | Admitting: Vascular Surgery

## 2017-02-05 ENCOUNTER — Ambulatory Visit (INDEPENDENT_AMBULATORY_CARE_PROVIDER_SITE_OTHER): Payer: Medicare Other | Admitting: Vascular Surgery

## 2017-02-05 ENCOUNTER — Encounter (INDEPENDENT_AMBULATORY_CARE_PROVIDER_SITE_OTHER): Payer: Self-pay | Admitting: Vascular Surgery

## 2017-02-05 VITALS — BP 108/73 | HR 89 | Resp 17 | Wt 151.0 lb

## 2017-02-05 DIAGNOSIS — I83813 Varicose veins of bilateral lower extremities with pain: Secondary | ICD-10-CM

## 2017-02-05 NOTE — Patient Instructions (Signed)
Sclerotherapy Sclerotherapy is a procedure that is done to improve the appearance of varicose veins and spider veins and to help relieve aching, swelling, cramping, and pain in the legs. Varicose veins are veins that have become enlarged, bulging, and twisted due to a damaged valve that causes blood to collect (pool) in the veins. Spider veins are small varicose veins. Sclerotherapy usually works best for smaller spider and varicose veins. This procedure involves injecting a chemical into the vein to close it off. You may need more than one treatment to close a vein all the way. Sclerotherapy is usually performed on the legs because that is where varicose and spider veins most often occur. Tell a health care provider about:  Any allergies you have.  All medicines you are taking, including vitamins, herbs, eye drops, creams, and over-the-counter medicines.  Any blood disorders you have.  Any surgeries you have had.  Any medical conditions you have.  Whether you are pregnant or may be pregnant. What are the risks? Generally, this is a safe procedure. However, problems may occur, including:  Infection.  Bleeding.  Allergic reactions to medicines or dyes.  Blood clots.  Nerve damage.  Bruising and scarring.  Darkened skin around the area.  What happens before the procedure?  Do not use lotions or creams on your legs unless your health care provider approves.  Follow instructions from your health care provider about eating and drinking restrictions.  Do not use any products that contain nicotine or tobacco, such as cigarettes and e-cigarettes. If you need help quitting, ask your health care provider.  Ask your health care provider about: ? Changing or stopping your regular medicines. This is especially important if you are taking diabetes medicines or blood thinners. ? Taking medicines such as aspirin and ibuprofen. These medicines can thin your blood. Do not take these  medicines before your procedure if your health care provider instructs you not to.  You may have an ultrasound of the affected area to check for blood clots and to check blood flow.  In rare cases, you may have an X-ray procedure to check how blood flows through your veins (angiogram). For an angiogram, a dye is injected to outline your veins on X-rays. What happens during the procedure?  To lower your risk of infection: ? Your health care team will wash or sanitize their hands. ? Your skin will be washed with soap. ? Hair may be removed from the treatment area.  A small, thin needle will be used to inject a chemical (sclerosant) into your varicose vein. The sclerosant will irritate the lining of the vein and cause the vein to close below the injection site. You may feel some stinging, burning, or irritation.  The injection may be repeated for more than one varicose vein.  The injection area will be wrapped with elastic bandages. The procedure may vary among health care providers and hospitals. What happens after the procedure?  Your injection area will be wrapped with elastic bandages. If there is bleeding, the bandages may be changed.  Do not drive until your health care provider approves. You may need to wait 1-2 days before driving.  You will need to wear compression stockings for about a week, or as long as your health care provider recommends. Summary  Sclerotherapy is a procedure that is done to improve the appearance of varicose veins and spider veins and to help relieve aching, swelling, cramping, and pain in the legs.  A small, thin needle is   used to inject a chemical (sclerosant) into a spider vein or varicose vein to close it off.  Elastic bandages will be wrapped around the injection area after the procedure. This information is not intended to replace advice given to you by your health care provider. Make sure you discuss any questions you have with your health care  provider. Document Released: 02/14/2016 Document Revised: 02/14/2016 Document Reviewed: 02/14/2016 [employer] Interactive Patient Education  2018 [employer]  

## 2017-02-05 NOTE — Progress Notes (Signed)
  Olivia Young is a 52 y.o.female who presents with painful varicose veins of the left leg  Past Medical History:  Diagnosis Date  . Diverticula, colon     Past Surgical History:  Procedure Laterality Date  . BACK SURGERY    . SHOULDER SURGERY      Current Outpatient Medications  Medication Sig Dispense Refill  . albuterol (PROVENTIL HFA;VENTOLIN HFA) 108 (90 Base) MCG/ACT inhaler Inhale into the lungs.    Marland Kitchen. amphetamine-dextroamphetamine (ADDERALL) 20 MG tablet Take 20 mg by mouth 2 (two) times daily.    Marland Kitchen. buPROPion (WELLBUTRIN SR) 200 MG 12 hr tablet Take 200 mg by mouth 2 (two) times daily.    . cyclobenzaprine (FLEXERIL) 5 MG tablet     . diazepam (VALIUM) 10 MG tablet Take 10 mg by mouth 4 (four) times daily.    Marland Kitchen. dicyclomine (BENTYL) 10 MG capsule Take by mouth.    . esomeprazole (NEXIUM) 40 MG capsule Take 40 mg by mouth daily at 12 noon.    . fluticasone (FLONASE) 50 MCG/ACT nasal spray Place into the nose.    Marland Kitchen. HYDROcodone-acetaminophen (NORCO) 7.5-325 MG per tablet Take 1 tablet by mouth every 6 (six) hours as needed for moderate pain.    Marland Kitchen. lubiprostone (AMITIZA) 24 MCG capsule take 1 capsule by mouth every morning and 2 capsules by mouth every evening    . meclizine (ANTIVERT) 25 MG tablet Take by mouth.    . mirtazapine (REMERON) 15 MG tablet take 1/2 tablet by mouth at bedtime for 2 weeks then INCREASE TO 1 TABLET at bedtime if needed    . mirtazapine (REMERON) 30 MG tablet     . montelukast (SINGULAIR) 10 MG tablet     . naproxen (NAPROSYN) 250 MG tablet Take 1 tablet (250 mg total) by mouth 2 (two) times daily with a meal. 40 tablet 0  . ranitidine (ZANTAC) 150 MG tablet Take 150 mg by mouth every morning.    . simvastatin (ZOCOR) 20 MG tablet Take 20 mg by mouth at bedtime.    . topiramate (TOPAMAX) 50 MG tablet Take 50 mg by mouth daily.    Marland Kitchen. zolpidem (AMBIEN) 10 MG tablet Take 10 mg by mouth at bedtime.     No current facility-administered medications for this  visit.     Allergies  Allergen Reactions  . Acetaminophen-Codeine Nausea Only  . Codeine Nausea Only  . Penicillins Rash    Indication: Patient presents with symptomatic varicose veins of the left lower extremity.  Procedure: Foam sclerotherapy was performed on the left lower extremity. Using ultrasound guidance, 5 mL of foam Sotradecol was used to inject the varicosities of the left lower extremity. Compression wraps were placed. The patient tolerated the procedure well.

## 2017-03-26 ENCOUNTER — Encounter (INDEPENDENT_AMBULATORY_CARE_PROVIDER_SITE_OTHER): Payer: Self-pay | Admitting: Vascular Surgery

## 2017-03-26 ENCOUNTER — Ambulatory Visit (INDEPENDENT_AMBULATORY_CARE_PROVIDER_SITE_OTHER): Payer: Medicare Other | Admitting: Vascular Surgery

## 2017-03-26 VITALS — BP 95/60 | HR 96 | Resp 15 | Ht 65.0 in | Wt 149.0 lb

## 2017-03-26 DIAGNOSIS — I83813 Varicose veins of bilateral lower extremities with pain: Secondary | ICD-10-CM

## 2017-03-26 NOTE — Progress Notes (Signed)
Olivia Young is a 52 y.o.female who presents with painful varicose veins of the right leg  Past Medical History:  Diagnosis Date  . Diverticula, colon     Past Surgical History:  Procedure Laterality Date  . BACK SURGERY    . SHOULDER SURGERY      Current Outpatient Medications  Medication Sig Dispense Refill  . albuterol (PROVENTIL HFA;VENTOLIN HFA) 108 (90 Base) MCG/ACT inhaler Inhale into the lungs.    Marland Kitchen. amphetamine-dextroamphetamine (ADDERALL) 20 MG tablet Take 20 mg by mouth 2 (two) times daily.    Marland Kitchen. buPROPion (WELLBUTRIN SR) 200 MG 12 hr tablet Take 200 mg by mouth 2 (two) times daily.    . cyclobenzaprine (FLEXERIL) 5 MG tablet     . diazepam (VALIUM) 10 MG tablet Take 10 mg by mouth 4 (four) times daily.    Marland Kitchen. dicyclomine (BENTYL) 10 MG capsule Take by mouth.    . esomeprazole (NEXIUM) 40 MG capsule Take 40 mg by mouth daily at 12 noon.    . fluticasone (FLONASE) 50 MCG/ACT nasal spray Place into the nose.    Marland Kitchen. HYDROcodone-acetaminophen (NORCO) 7.5-325 MG per tablet Take 1 tablet by mouth every 6 (six) hours as needed for moderate pain.    Marland Kitchen. lubiprostone (AMITIZA) 24 MCG capsule take 1 capsule by mouth every morning and 2 capsules by mouth every evening    . meclizine (ANTIVERT) 25 MG tablet Take by mouth.    . mirtazapine (REMERON) 15 MG tablet take 1/2 tablet by mouth at bedtime for 2 weeks then INCREASE TO 1 TABLET at bedtime if needed    . mirtazapine (REMERON) 30 MG tablet     . montelukast (SINGULAIR) 10 MG tablet     . naproxen (NAPROSYN) 250 MG tablet Take 1 tablet (250 mg total) by mouth 2 (two) times daily with a meal. 40 tablet 0  . ranitidine (ZANTAC) 150 MG tablet Take 150 mg by mouth every morning.    . simvastatin (ZOCOR) 20 MG tablet Take 20 mg by mouth at bedtime.    . topiramate (TOPAMAX) 50 MG tablet Take 50 mg by mouth daily.    Marland Kitchen. zolpidem (AMBIEN) 10 MG tablet Take 10 mg by mouth at bedtime.     No current facility-administered medications for this  visit.     Allergies  Allergen Reactions  . Acetaminophen-Codeine Nausea Only  . Codeine Nausea Only  . Penicillins Rash    Indication: Patient presents with symptomatic varicose veins of the right lower extremity.  Procedure: Foam sclerotherapy was performed on the right lower extremity. Using ultrasound guidance, 5 mL of foam Sotradecol was used to inject the varicosities of the right lower extremity. Compression wraps were placed. The patient tolerated the procedure well.

## 2017-03-29 ENCOUNTER — Ambulatory Visit (INDEPENDENT_AMBULATORY_CARE_PROVIDER_SITE_OTHER): Payer: Medicare Other | Admitting: Vascular Surgery

## 2017-04-12 ENCOUNTER — Other Ambulatory Visit: Payer: Self-pay | Admitting: Neurological Surgery

## 2017-04-12 DIAGNOSIS — Z981 Arthrodesis status: Secondary | ICD-10-CM

## 2017-06-04 ENCOUNTER — Encounter: Payer: Self-pay | Admitting: Neurology

## 2017-08-12 DIAGNOSIS — M2550 Pain in unspecified joint: Secondary | ICD-10-CM | POA: Insufficient documentation

## 2017-08-12 DIAGNOSIS — I7300 Raynaud's syndrome without gangrene: Secondary | ICD-10-CM | POA: Insufficient documentation

## 2017-08-12 DIAGNOSIS — M47816 Spondylosis without myelopathy or radiculopathy, lumbar region: Secondary | ICD-10-CM | POA: Insufficient documentation

## 2017-08-12 DIAGNOSIS — Z13820 Encounter for screening for osteoporosis: Secondary | ICD-10-CM | POA: Insufficient documentation

## 2017-08-12 DIAGNOSIS — R5382 Chronic fatigue, unspecified: Secondary | ICD-10-CM | POA: Insufficient documentation

## 2017-08-16 ENCOUNTER — Encounter: Payer: Self-pay | Admitting: Neurology

## 2017-08-16 ENCOUNTER — Encounter

## 2017-08-16 ENCOUNTER — Ambulatory Visit (INDEPENDENT_AMBULATORY_CARE_PROVIDER_SITE_OTHER): Payer: Medicare Other | Admitting: Neurology

## 2017-08-16 VITALS — BP 122/78 | HR 88 | Ht 63.0 in | Wt 163.0 lb

## 2017-08-16 DIAGNOSIS — G43709 Chronic migraine without aura, not intractable, without status migrainosus: Secondary | ICD-10-CM | POA: Diagnosis not present
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
## 2017-08-16 NOTE — Patient Instructions (Signed)
1.  We will get pre-authorization and set you up for Botox

## 2017-08-19 ENCOUNTER — Other Ambulatory Visit: Payer: Self-pay | Admitting: Internal Medicine

## 2017-08-19 DIAGNOSIS — Z1231 Encounter for screening mammogram for malignant neoplasm of breast: Secondary | ICD-10-CM

## 2017-08-26 ENCOUNTER — Ambulatory Visit: Payer: Medicare Other

## 2017-08-28 ENCOUNTER — Encounter (INDEPENDENT_AMBULATORY_CARE_PROVIDER_SITE_OTHER): Payer: Self-pay

## 2017-08-28 ENCOUNTER — Ambulatory Visit
Admission: RE | Admit: 2017-08-28 | Discharge: 2017-08-28 | Disposition: A | Discharge status: Home / Self Care | Payer: Medicare Other | Source: Ambulatory Visit | Attending: Internal Medicine | Admitting: Internal Medicine

## 2017-08-28 DIAGNOSIS — Z1231 Encounter for screening mammogram for malignant neoplasm of breast: Secondary | ICD-10-CM | POA: Insufficient documentation

## 2017-09-04 ENCOUNTER — Other Ambulatory Visit: Payer: Medicare Other

## 2017-09-14 ENCOUNTER — Emergency Department (HOSPITAL_COMMUNITY)
Admission: EM | Admit: 2017-09-14 | Discharge: 2017-09-14 | Disposition: A | Discharge status: Home / Self Care | Payer: Medicare Other | Attending: Emergency Medicine | Admitting: Emergency Medicine

## 2017-09-14 ENCOUNTER — Other Ambulatory Visit: Payer: Self-pay

## 2017-09-14 ENCOUNTER — Encounter (HOSPITAL_COMMUNITY): Payer: Self-pay | Admitting: Emergency Medicine

## 2017-09-14 DIAGNOSIS — Z5321 Procedure and treatment not carried out due to patient leaving prior to being seen by health care provider: Secondary | ICD-10-CM | POA: Diagnosis not present

## 2017-09-14 DIAGNOSIS — M545 Low back pain: Secondary | ICD-10-CM | POA: Diagnosis present

## 2017-09-14 HISTORY — DX: Pain in unspecified joint: M25.50

## 2017-09-14 HISTORY — DX: Dorsalgia, unspecified: M54.9

## 2017-09-14 HISTORY — DX: Migraine, unspecified, not intractable, without status migrainosus: G43.909

## 2017-09-14 HISTORY — DX: Cervicalgia: M54.2

## 2017-09-14 HISTORY — DX: Migraine with aura, not intractable, without status migrainosus: G43.109

## 2017-09-14 HISTORY — DX: Other migraine, not intractable, without status migrainosus: G43.809

## 2017-09-14 HISTORY — DX: Other chronic pain: G89.29

## 2017-09-14 LAB — URINALYSIS, ROUTINE W REFLEX MICROSCOPIC
BILIRUBIN URINE: NEGATIVE
Glucose, UA: NEGATIVE mg/dL
KETONES UR: NEGATIVE mg/dL
Nitrite: POSITIVE — AB
Protein, ur: NEGATIVE mg/dL
SPECIFIC GRAVITY, URINE: 1.006 (ref 1.005–1.030)
pH: 6 (ref 5.0–8.0)

## 2017-09-14 NOTE — ED Notes (Signed)
Patient called for room, no answer-patient left per registration.

## 2017-09-14 NOTE — ED Triage Notes (Addendum)
Patient c/o lower back pain that started at 4am this morning. Per patient has chronic back pain but pain is worse. Per patient believes she has UTI. Patient Denies pain with urination or blood in urine. Denies any nausea or vomiting. Per patient some stool incontinecy-patient states "I've had that for a while."  Patient reports taking ibuprofen with no relief. Per patient scheduled to have back injections on Tuesday.

## 2017-09-14 NOTE — ED Notes (Signed)
Patient called for room, no answer. 

## 2017-09-16 ENCOUNTER — Ambulatory Visit
Admission: EM | Admit: 2017-09-16 | Discharge: 2017-09-16 | Disposition: A | Discharge status: Home / Self Care | Payer: Medicare Other | Attending: Emergency Medicine | Admitting: Emergency Medicine

## 2017-09-16 ENCOUNTER — Other Ambulatory Visit: Payer: Self-pay

## 2017-09-16 DIAGNOSIS — R319 Hematuria, unspecified: Secondary | ICD-10-CM | POA: Diagnosis not present

## 2017-09-16 DIAGNOSIS — F17210 Nicotine dependence, cigarettes, uncomplicated: Secondary | ICD-10-CM | POA: Insufficient documentation

## 2017-09-16 DIAGNOSIS — M545 Low back pain, unspecified: Secondary | ICD-10-CM

## 2017-09-16 DIAGNOSIS — N390 Urinary tract infection, site not specified: Secondary | ICD-10-CM | POA: Insufficient documentation

## 2017-09-16 DIAGNOSIS — Z79899 Other long term (current) drug therapy: Secondary | ICD-10-CM | POA: Insufficient documentation

## 2017-09-16 DIAGNOSIS — R109 Unspecified abdominal pain: Secondary | ICD-10-CM | POA: Diagnosis present

## 2017-09-16 LAB — URINALYSIS, COMPLETE (UACMP) WITH MICROSCOPIC
Squamous Epithelial / LPF: NONE SEEN (ref 0–5)
pH: 7 (ref 5.0–8.0)

## 2017-09-16 MED ORDER — PHENAZOPYRIDINE HCL 200 MG PO TABS: 200.0000 mg | ORAL_TABLET | Freq: Three times a day (TID) | ORAL | 0 refills | Status: DC | PRN

## 2017-09-16 MED ORDER — CEPHALEXIN 250 MG/5ML PO SUSR: 500.0000 mg | Freq: Four times a day (QID) | ORAL | 0 refills | Status: DC

## 2017-09-16 MED ORDER — CEFTRIAXONE SODIUM 1 G IJ SOLR
1.0000 g | Freq: Once | INTRAMUSCULAR | Status: AC
  Administered 2017-09-16: 1 g via INTRAMUSCULAR

## 2017-09-16 NOTE — ED Triage Notes (Signed)
Patient complains of low back pain, lower abdominal pain that started over the weekend. Patient states that she feels bloated and thought this could be a UTI. Reports that she went to ED in [place] this weekend but didn't want to wait.

## 2017-09-16 NOTE — ED Provider Notes (Signed)
HPI  SUBJECTIVE:  Olivia Young is a 51 y.o. female who presents with left lower back pain described as pressure, constant, low midline abdominal pain described as bloating for the past week.  She has a history of chronic back pain but states that this feels different than her usual back pain.  She denies fevers, but states that she has not checked her temperature.  She reports nausea starting yesterday.  No vomiting.  No dysuria, urgency, frequency, cloudy or odorous urine, hematuria.  No trauma to the back, recent heavy lifting, GU complaints.  No antipyretic in the past 4 to 6 hours.  No other abdominal pain.  She tried cranberry juice, Azo, ibuprofen with improvement in her symptoms.  Symptoms are worse with movement, walking. Patient went to [facility] on 9/7, 2 days ago, left before being seen.  Her UA was consistent with a UTI with moderate leuks, positive nitrite, small hematuria and bacteria.  She has a past medical history of UTI, pyelonephritis, nonobstructing nephrolithiasis, chronic back pain, esophageal spasms.  She requests liquid medications.  She also has history of GERD, spastic colon, lumbar fusion, status post hysterectomy.  No history of diabetes, hypertension.  She reports nausea only with penicillin.  She denies anaphylaxis.  PMD: None.  Past Medical History:  Diagnosis Date  . Chronic neck and back pain   . Diverticula, colon   . Migraine headache   . Polyarthralgia   . Vestibular migraine     Past Surgical History:  Procedure Laterality Date  . ABDOMINAL HYSTERECTOMY    . BACK SURGERY    . SHOULDER SURGERY      Family History  Problem Relation Age of Onset  . Stroke Mother   . Depression Mother   . CAD Mother   . CVA Mother   . Hypertension Mother   . Heart disease Father   . Heart attack Father 35  . Diabetes Sister 45  . Heart attack Brother 45  . Other Brother 15       MVA  . Breast cancer Neg Hx     Social History   Tobacco Use  . Smoking  status: Current Every Day Smoker    Packs/day: 0.50    Years: 30.00    Pack years: 15.00    Types: Cigarettes  . Smokeless tobacco: Never Used  Substance Use Topics  . Alcohol use: No  . Drug use: Never    No current facility-administered medications for this encounter.   Current Outpatient Medications:  .  albuterol (PROVENTIL HFA;VENTOLIN HFA) 108 (90 Base) MCG/ACT inhaler, Inhale into the lungs., Disp: , Rfl:  .  amphetamine-dextroamphetamine (ADDERALL) 20 MG tablet, Take 20 mg by mouth 2 (two) times daily., Disp: , Rfl:  .  buPROPion (WELLBUTRIN SR) 200 MG 12 hr tablet, Take 200 mg by mouth 2 (two) times daily., Disp: , Rfl:  .  diazepam (VALIUM) 10 MG tablet, Take 10 mg by mouth 4 (four) times daily., Disp: , Rfl:  .  dicyclomine (BENTYL) 10 MG capsule, Take by mouth., Disp: , Rfl:  .  esomeprazole (NEXIUM) 40 MG capsule, Take 40 mg by mouth daily at 12 noon., Disp: , Rfl:  .  fluticasone (FLONASE) 50 MCG/ACT nasal spray, Place into the nose., Disp: , Rfl:  .  ibuprofen (ADVIL,MOTRIN) 100 MG/5ML suspension, GIVE 40 MILLILITERS (MLS) BY MOUTH THREE TIMES DAILY, Disp: , Rfl: 1 .  lubiprostone (AMITIZA) 24 MCG capsule, take 1 capsule by mouth every morning  and 2 capsules by mouth every evening, Disp: , Rfl:  .  meclizine (ANTIVERT) 25 MG tablet, Take by mouth., Disp: , Rfl:  .  montelukast (SINGULAIR) 10 MG tablet, , Disp: , Rfl:  .  ranitidine (ZANTAC) 150 MG tablet, Take 150 mg by mouth every morning., Disp: , Rfl:  .  simvastatin (ZOCOR) 20 MG tablet, Take 20 mg by mouth at bedtime., Disp: , Rfl:  .  topiramate (TOPAMAX) 50 MG tablet, Take 50 mg by mouth daily., Disp: , Rfl:  .  zolpidem (AMBIEN) 10 MG tablet, Take 10 mg by mouth at bedtime., Disp: , Rfl:  .  cephALEXin (KEFLEX) 250 MG/5ML suspension, Take 10 mLs (500 mg total) by mouth 4 (four) times daily for 10 days. Max 500 mg/dose, Disp: 400 mL, Rfl: 0 .  phenazopyridine (PYRIDIUM) 200 MG tablet, Take 1 tablet (200 mg total)  by mouth 3 (three) times daily as needed for pain., Disp: 6 tablet, Rfl: 0  Allergies  Allergen Reactions  . Acetaminophen-Codeine Nausea Only  . Codeine Nausea Only  . Penicillins Rash     ROS  As noted in HPI.   Physical Exam  BP 113/88 (BP Location: Left Arm)   Pulse 97   Temp 98.8 F (37.1 C) (Oral)   Resp 18   Ht 5\' 5"  (1.651 m)   Wt 74.8 kg   SpO2 99%   BMI 27.46 kg/m   Constitutional: Well developed, well nourished, no acute distress Eyes:  EOMI, conjunctiva normal bilaterally HENT: Normocephalic, atraumatic,mucus membranes moist Respiratory: Normal inspiratory effort Cardiovascular: Normal rate GI: Normal appearance.  Soft, nondistended.  Active bowel sounds.  No suprapubic tenderness.  Positive left flank tenderness. Back: Positive mild left CVAT.  No lumbar bony tenderness.  Positive left paralumbar tenderness. baseline lower extremity ROM with intact  PT pulses.  No pain with flex/extension hips bilaterally. SLR neg bilaterally. Sensation baseline light touch bilaterally for Pt, DTR's symmetric and intact bilaterally KJ, Motor symmetric bilateral 5/5 hip flexion, quadriceps, hamstrings, EHL, foot dorsiflexion, foot plantarflexion, gait normal skin: No rash, skin intact Musculoskeletal: no deformities Neurologic: Alert & oriented x 3, no focal neuro deficits Psychiatric: Speech and behavior appropriate   ED Course   Medications  cefTRIAXone (ROCEPHIN) injection 1 g (1 g Intramuscular Given 09/16/17 1350)    Orders Placed This Encounter  Procedures  . Urine culture    Standing Status:   Standing    Number of Occurrences:   1    Order Specific Question:   Patient immune status    Answer:   Normal  . Urinalysis, Complete w Microscopic    Standing Status:   Standing    Number of Occurrences:   1    Results for orders placed or performed during the hospital encounter of 09/16/17 (from the past 24 hour(s))  Urinalysis, Complete w Microscopic     Status:  Abnormal   Collection Time: 09/16/17 12:52 PM  Result Value Ref Range   Color, Urine ORANGE (A) YELLOW   APPearance CLOUDY (A) CLEAR   Specific Gravity, Urine  1.005 - 1.030    TEST NOT REPORTED DUE TO COLOR INTERFERENCE OF URINE PIGMENT   pH 7.0 5.0 - 8.0   Glucose, UA (A) NEGATIVE mg/dL    TEST NOT REPORTED DUE TO COLOR INTERFERENCE OF URINE PIGMENT   Hgb urine dipstick (A) NEGATIVE    TEST NOT REPORTED DUE TO COLOR INTERFERENCE OF URINE PIGMENT   Bilirubin Urine (A) NEGATIVE  TEST NOT REPORTED DUE TO COLOR INTERFERENCE OF URINE PIGMENT   Ketones, ur (A) NEGATIVE mg/dL    TEST NOT REPORTED DUE TO COLOR INTERFERENCE OF URINE PIGMENT   Protein, ur (A) NEGATIVE mg/dL    TEST NOT REPORTED DUE TO COLOR INTERFERENCE OF URINE PIGMENT   Nitrite (A) NEGATIVE    TEST NOT REPORTED DUE TO COLOR INTERFERENCE OF URINE PIGMENT   Leukocytes, UA (A) NEGATIVE    TEST NOT REPORTED DUE TO COLOR INTERFERENCE OF URINE PIGMENT   Squamous Epithelial / LPF NONE SEEN 0 - 5   WBC, UA 21-50 0 - 5 WBC/hpf   RBC / HPF 6-10 0 - 5 RBC/hpf   Bacteria, UA MANY (A) NONE SEEN   No results found.  ED Clinical Impression  Urinary tract infection with hematuria, site unspecified  Acute left-sided low back pain without sciatica   ED Assessment/Plan  Care everywhere, ER records and labs reviewed.  As noted in HPI. Patient had abdominal CT at [facility] in October 2018 that was negative for kidney stones.  Presentation most consistent with an ascending urinary tract infection possibly early pyelonephritis.  This could be nephrolithiasis, but the pain would be very atypical for that.  May also have musculoskeletal component as she does have some left paralumbar tenderness.  She is afebrile here and has not taken any antipyretics in the past 4 to 6 hours.  Her vitals are otherwise normal.  No evidence of cauda equina syndrome.  Giving 1 g of Rocephin.  Patient states that her allergy to penicillin is nausea.  She denies  lip swelling, tongue swelling, shortness of breath, rash, anaphylaxis.  Today's UA shows many bacteria, patient took AZO so unable to get results.  UA 2 days ago consistent with UTI.  Will send today's sample for culture to confirm antibiotic choice and diagnosis of UTI.   Will send home with 10 days of Keflex, Pyridium, advised her to add Tylenol to the ibuprofen. Discussed labs, MDM, treatment plan, and plan for follow-up with patient. Discussed sn/sx that should prompt return to the ED. patient agrees with plan.   Meds ordered this encounter  Medications  . cefTRIAXone (ROCEPHIN) injection 1 g  . cephALEXin (KEFLEX) 250 MG/5ML suspension    Sig: Take 10 mLs (500 mg total) by mouth 4 (four) times daily for 10 days. Max 500 mg/dose    Dispense:  400 mL    Refill:  0  . phenazopyridine (PYRIDIUM) 200 MG tablet    Sig: Take 1 tablet (200 mg total) by mouth 3 (three) times daily as needed for pain.    Dispense:  6 tablet    Refill:  0    *This clinic note was created using Scientist, clinical (histocompatibility and immunogenetics). Therefore, there may be occasional mistakes despite careful proofreading.   ?   Domenick Gong, MD 09/17/17 [id number]

## 2017-09-16 NOTE — Discharge Instructions (Addendum)
Add a gram of Tylenol to your ibuprofen and take this together 3 or 4 times a day as needed for pain.  The Pyridium will also help with the pain, but it did not come in liquid form.  The Keflex was available in liquid form.  Take it as directed.  This will treat an infection.  Continue drinking plenty of extra fluids.  Go immediately to the ER for the signs and symptoms we discussed.  Follow-up with a primary care physician of your choice, see list below.  Below is a list of primary care practices who are taking new patients for you to follow-up with. [facility] [address] [place], [place] [address] [phone]  [facility] [phone] [address] [place] [address]  [facility]: [address] [place] [address]  [phone]  [facility]: [address] [place] [address]   [phone]  [facility]: [address] [place] [address]  [phone]  [place] [facility] : [address] [place] [address] [phone]  [facility]: [address] [place] [address] [phone]  [facility]: [address] [place] [address] [phone]  Dr. Oneal Grout [address] [facility] [place] [address]  [phone]  Dr. Jackie Plum, [facility]. [address] [place], [place] [address]  [phone]  Go to www.goodrx.com to look up your medications. This will give you a list of where you can find your prescriptions at the most affordable prices. Or ask the pharmacist what the cash price is, or if they have any other discount programs available to help make your medication more affordable. This can be less expensive than what  you would pay with insurance.

## 2017-09-17 ENCOUNTER — Inpatient Hospital Stay
Admission: RE | Admit: 2017-09-17 | Discharge: 2017-09-17 | Disposition: A | Discharge status: Home / Self Care | Payer: Medicare Other | Source: Ambulatory Visit | Attending: Neurological Surgery | Admitting: Neurological Surgery

## 2017-09-18 LAB — URINE CULTURE
Culture: >=100000 — AB
Special Requests: NORMAL

## 2017-09-19 ENCOUNTER — Telehealth: Payer: Self-pay | Admitting: Emergency Medicine

## 2017-09-19 NOTE — Telephone Encounter (Signed)
Patient called wanting her culture result.  Patient was informed of her urine culture result and that the bacteria was sensitive to the antibiotic Keflex that she is taking.  Patient states that her symptoms have started to improve today.  Patient to continue with her antibiotic and follow-up here or in the ED if her symptoms worsen.  Patient verbalized understanding.

## 2017-09-26 ENCOUNTER — Observation Stay
Admission: EM | Admit: 2017-09-26 | Discharge: 2017-09-27 | Disposition: A | Discharge status: Home / Self Care | Payer: Medicare Other | Attending: Internal Medicine | Admitting: Internal Medicine

## 2017-09-26 ENCOUNTER — Encounter: Payer: Self-pay | Admitting: Emergency Medicine

## 2017-09-26 ENCOUNTER — Other Ambulatory Visit: Payer: Self-pay

## 2017-09-26 ENCOUNTER — Emergency Department: Payer: Medicare Other

## 2017-09-26 DIAGNOSIS — K859 Acute pancreatitis without necrosis or infection, unspecified: Secondary | ICD-10-CM | POA: Diagnosis present

## 2017-09-26 DIAGNOSIS — Z880 Allergy status to penicillin: Secondary | ICD-10-CM | POA: Diagnosis not present

## 2017-09-26 DIAGNOSIS — F17210 Nicotine dependence, cigarettes, uncomplicated: Secondary | ICD-10-CM | POA: Insufficient documentation

## 2017-09-26 DIAGNOSIS — M797 Fibromyalgia: Secondary | ICD-10-CM | POA: Insufficient documentation

## 2017-09-26 DIAGNOSIS — N390 Urinary tract infection, site not specified: Secondary | ICD-10-CM | POA: Diagnosis not present

## 2017-09-26 DIAGNOSIS — Z79899 Other long term (current) drug therapy: Secondary | ICD-10-CM | POA: Insufficient documentation

## 2017-09-26 DIAGNOSIS — K589 Irritable bowel syndrome without diarrhea: Principal | ICD-10-CM

## 2017-09-26 DIAGNOSIS — G43909 Migraine, unspecified, not intractable, without status migrainosus: Secondary | ICD-10-CM | POA: Diagnosis not present

## 2017-09-26 DIAGNOSIS — Z9049 Acquired absence of other specified parts of digestive tract: Secondary | ICD-10-CM | POA: Insufficient documentation

## 2017-09-26 DIAGNOSIS — Z885 Allergy status to narcotic agent status: Secondary | ICD-10-CM | POA: Insufficient documentation

## 2017-09-26 DIAGNOSIS — K8500 Idiopathic acute pancreatitis without necrosis or infection: Secondary | ICD-10-CM | POA: Diagnosis present

## 2017-09-26 LAB — CBC
HCT: 40.4 % (ref 35.0–47.0)
Hemoglobin: 13.9 g/dL (ref 12.0–16.0)
MCH: 31.9 pg (ref 26.0–34.0)
MCHC: 34.5 g/dL (ref 32.0–36.0)
MCV: 92.6 fL (ref 80.0–100.0)
PLATELETS: 285 10*3/uL (ref 150–440)
RBC: 4.36 MIL/uL (ref 3.80–5.20)
RDW: 13 % (ref 11.5–14.5)
WBC: 6.9 10*3/uL (ref 3.6–11.0)

## 2017-09-26 LAB — COMPREHENSIVE METABOLIC PANEL
ALK PHOS: 44 U/L (ref 38–126)
ALT: 16 U/L (ref 0–44)
AST: 18 U/L (ref 15–41)
Albumin: 4.2 g/dL (ref 3.5–5.0)
Anion gap: 6 (ref 5–15)
BILIRUBIN TOTAL: 0.4 mg/dL (ref 0.3–1.2)
BUN: 10 mg/dL (ref 6–20)
CALCIUM: 9 mg/dL (ref 8.9–10.3)
CO2: 23 mmol/L (ref 22–32)
Chloride: 112 mmol/L — ABNORMAL HIGH (ref 98–111)
Creatinine, Ser: 1.04 mg/dL — ABNORMAL HIGH (ref 0.44–1.00)
GFR calc Af Amer: >60 mL/min (ref >60)
GFR calc non Af Amer: >60 mL/min (ref >60)
Glucose, Bld: 100 mg/dL — ABNORMAL HIGH (ref 70–99)
Potassium: 4.1 mmol/L (ref 3.5–5.1)
Sodium: 141 mmol/L (ref 135–145)
Total Protein: 7 g/dL (ref 6.5–8.1)

## 2017-09-26 LAB — URINALYSIS, COMPLETE (UACMP) WITH MICROSCOPIC
Bacteria, UA: NONE SEEN
Bilirubin Urine: NEGATIVE
GLUCOSE, UA: NEGATIVE mg/dL
Hgb urine dipstick: NEGATIVE
Ketones, ur: NEGATIVE mg/dL
Leukocytes, UA: NEGATIVE
NITRITE: NEGATIVE
PH: 6 (ref 5.0–8.0)
Protein, ur: NEGATIVE mg/dL
SPECIFIC GRAVITY, URINE: 1.016 (ref 1.005–1.030)

## 2017-09-26 LAB — LIPASE, BLOOD: Lipase: 95 U/L — ABNORMAL HIGH (ref 11–51)

## 2017-09-26 MED ORDER — BUPROPION HCL ER (SR) 100 MG PO TB12
200.0000 mg | ORAL_TABLET | Freq: Two times a day (BID) | ORAL | Status: DC
  Filled 2017-09-26: qty 2

## 2017-09-26 MED ORDER — DIAZEPAM 5 MG PO TABS
10.0000 mg | ORAL_TABLET | Freq: Four times a day (QID) | ORAL | Status: DC
  Administered 2017-09-26 – 2017-09-27 (×2): 10 mg via ORAL
  Filled 2017-09-26 (×3): qty 2

## 2017-09-26 MED ORDER — ENOXAPARIN SODIUM 40 MG/0.4ML ~~LOC~~ SOLN
40.0000 mg | SUBCUTANEOUS | Status: DC
  Administered 2017-09-26: 40 mg via SUBCUTANEOUS
  Filled 2017-09-26: qty 0.4

## 2017-09-26 MED ORDER — ZOLPIDEM TARTRATE 10 MG PO TABS: 5.0000 mg | ORAL_TABLET | Freq: Every day | ORAL | Status: DC

## 2017-09-26 MED ORDER — TRAZODONE HCL 50 MG PO TABS
25.0000 mg | ORAL_TABLET | Freq: Every evening | ORAL | Status: DC | PRN
  Administered 2017-09-26: 25 mg via ORAL
  Filled 2017-09-26: qty 1

## 2017-09-26 MED ORDER — SODIUM CHLORIDE 0.9 % IV BOLUS
1000.0000 mL | Freq: Once | INTRAVENOUS | Status: AC
  Administered 2017-09-26: 1000 mL via INTRAVENOUS

## 2017-09-26 MED ORDER — DICYCLOMINE HCL 10 MG PO CAPS
10.0000 mg | ORAL_CAPSULE | Freq: Four times a day (QID) | ORAL | Status: DC | PRN
  Administered 2017-09-27: 10 mg via ORAL
  Filled 2017-09-26: qty 1

## 2017-09-26 MED ORDER — TOPIRAMATE 25 MG PO TABS
50.0000 mg | ORAL_TABLET | Freq: Every day | ORAL | Status: DC
  Administered 2017-09-27: 50 mg via ORAL
  Filled 2017-09-26: qty 2

## 2017-09-26 MED ORDER — IOPAMIDOL (ISOVUE-300) INJECTION 61%
100.0000 mL | Freq: Once | INTRAVENOUS | Status: AC | PRN
  Administered 2017-09-26: 100 mL via INTRAVENOUS

## 2017-09-26 MED ORDER — LUBIPROSTONE 8 MCG PO CAPS
8.0000 ug | ORAL_CAPSULE | Freq: Every day | ORAL | Status: DC
  Administered 2017-09-27: 8 ug via ORAL
  Filled 2017-09-26: qty 1

## 2017-09-26 MED ORDER — PANTOPRAZOLE SODIUM 40 MG PO TBEC: 80.0000 mg | DELAYED_RELEASE_TABLET | Freq: Every day | ORAL | Status: DC

## 2017-09-26 MED ORDER — ONDANSETRON HCL 4 MG/2ML IJ SOLN
4.0000 mg | Freq: Once | INTRAMUSCULAR | Status: AC
  Administered 2017-09-26: 4 mg via INTRAVENOUS
  Filled 2017-09-26: qty 2

## 2017-09-26 MED ORDER — HYDROMORPHONE HCL 1 MG/ML IJ SOLN
1.0000 mg | INTRAMUSCULAR | Status: DC | PRN
  Administered 2017-09-26 – 2017-09-27 (×3): 1 mg via INTRAVENOUS
  Filled 2017-09-26 (×3): qty 1

## 2017-09-26 MED ORDER — HYDROMORPHONE HCL 1 MG/ML IJ SOLN
1.0000 mg | Freq: Once | INTRAMUSCULAR | Status: AC
  Administered 2017-09-26: 1 mg via INTRAVENOUS
  Filled 2017-09-26: qty 1

## 2017-09-26 MED ORDER — ONDANSETRON HCL 4 MG/2ML IJ SOLN: 4.0000 mg | Freq: Four times a day (QID) | INTRAMUSCULAR | Status: DC | PRN

## 2017-09-26 MED ORDER — INFLUENZA VAC SPLIT QUAD 0.5 ML IM SUSY
0.5000 mL | PREFILLED_SYRINGE | INTRAMUSCULAR | Status: DC
  Filled 2017-09-26: qty 0.5

## 2017-09-26 MED ORDER — AMPHETAMINE-DEXTROAMPHETAMINE 5 MG PO TABS
20.0000 mg | ORAL_TABLET | Freq: Two times a day (BID) | ORAL | Status: DC
  Filled 2017-09-26: qty 4

## 2017-09-26 MED ORDER — MORPHINE SULFATE (PF) 2 MG/ML IV SOLN
2.0000 mg | INTRAVENOUS | Status: DC | PRN
  Administered 2017-09-26: 2 mg via INTRAVENOUS
  Filled 2017-09-26: qty 1

## 2017-09-26 MED ORDER — IOPAMIDOL (ISOVUE-300) INJECTION 61%
30.0000 mL | Freq: Once | INTRAVENOUS | Status: AC | PRN
  Administered 2017-09-26: 30 mL via ORAL

## 2017-09-26 MED ORDER — ONDANSETRON HCL 4 MG PO TABS: 4.0000 mg | ORAL_TABLET | Freq: Four times a day (QID) | ORAL | Status: DC | PRN

## 2017-09-26 MED ORDER — SODIUM CHLORIDE 0.9 % IV SOLN
INTRAVENOUS | Status: DC
  Administered 2017-09-26: 18:00:00 via INTRAVENOUS

## 2017-09-26 MED ORDER — BISACODYL 5 MG PO TBEC: 5.0000 mg | DELAYED_RELEASE_TABLET | Freq: Every day | ORAL | Status: DC | PRN

## 2017-09-26 MED ORDER — HYDROMORPHONE HCL 1 MG/ML IJ SOLN
1.0000 mg | Freq: Once | INTRAMUSCULAR | Status: AC | PRN
  Administered 2017-09-26: 1 mg via INTRAVENOUS
  Filled 2017-09-26: qty 1

## 2017-09-26 MED ORDER — BUPROPION HCL ER (XL) 150 MG PO TB24
300.0000 mg | ORAL_TABLET | Freq: Every day | ORAL | Status: DC
  Administered 2017-09-27: 300 mg via ORAL
  Filled 2017-09-26: qty 2

## 2017-09-26 MED ORDER — MORPHINE SULFATE (PF) 4 MG/ML IV SOLN
4.0000 mg | Freq: Once | INTRAVENOUS | Status: AC
  Administered 2017-09-26: 4 mg via INTRAVENOUS
  Filled 2017-09-26: qty 1

## 2017-09-26 MED ORDER — DOCUSATE SODIUM 100 MG PO CAPS
100.0000 mg | ORAL_CAPSULE | Freq: Two times a day (BID) | ORAL | Status: DC
  Administered 2017-09-26 – 2017-09-27 (×2): 100 mg via ORAL
  Filled 2017-09-26 (×2): qty 1

## 2017-09-26 NOTE — ED Notes (Signed)
Patient transported to room 217 

## 2017-09-26 NOTE — ED Notes (Signed)
Ginger ale po  -  Pt reports pain is better at this time

## 2017-09-26 NOTE — ED Notes (Signed)
Nurse asked Dr. Shaune PollackLord for pain medication for pt due to pt being in the hall stating "I feel like I'm going to explode."

## 2017-09-26 NOTE — ED Notes (Addendum)
First Nurse Note: [facility], with report of back pain.  Recent hx of "kidney infection" last dose of abx yesterday.  Pain started this AM.  Afebrile and VSS at scene.  Given Fentanyl and Zofran IV via EMS enroute.  20 ga IV in Left AC.  [facility] [place].  Alert.  Patient wanting to lie down, reassured that she was next for triage.

## 2017-09-26 NOTE — Progress Notes (Signed)
Patient requesting that home bentyl be ordered.  Wellbutrin SR 200mg  bid ordered but patient takes wellbutrin XL 300 QD.  Ordered received from dr Nancy Marusmayo for wellbutrin XL 300 QD and bentyl

## 2017-09-26 NOTE — H&P (Addendum)
[facility] [place] [facility]   PATIENT NAME: Olivia Young    MR#:  [id number]  DATE OF BIRTH:  03/21/1965  DATE OF ADMISSION:  09/26/2017  PRIMARY CARE PHYSICIAN: Patient, No Pcp Per   REQUESTING/REFERRING PHYSICIAN: Dr. Governor Rooks  CHIEF COMPLAINT: Abdominal pain   Chief Complaint  Patient presents with  . Flank Pain  . Abdominal Pain    HISTORY OF PRESENT ILLNESS:  Olivia Young  is a 52 y.o. female with a known history of IBS, chronic back pain due to lumbar spondylosis, fibromyalgia, who follows up with rheumatology at [facility], gastroenterology at [facility] for her IBS comes in because of abdominal pain in epigastric area radiated to the back associated with nausea.,  Patient had a history of cholecystectomy lipase is slightly elevated to 95.  PAST MEDICAL HISTORY:   Past Medical History:  Diagnosis Date  . Chronic neck and back pain   . Diverticula, colon   . Migraine headache   . Polyarthralgia   . Vestibular migraine     PAST SURGICAL HISTOIRY:   Past Surgical History:  Procedure Laterality Date  . ABDOMINAL HYSTERECTOMY    . BACK SURGERY    . SHOULDER SURGERY      SOCIAL HISTORY:   Social History   Tobacco Use  . Smoking status: Current Every Day Smoker    Packs/day: 0.50    Years: 30.00    Pack years: 15.00    Types: Cigarettes  . Smokeless tobacco: Never Used  Substance Use Topics  . Alcohol use: No    FAMILY HISTORY:   Family History  Problem Relation Age of Onset  . Stroke Mother   . Depression Mother   . CAD Mother   . CVA Mother   . Hypertension Mother   . Heart disease Father   . Heart attack Father 93  . Diabetes Sister 19  . Heart attack Brother 45  . Other Brother 15       MVA  . Breast cancer Neg Hx     DRUG ALLERGIES:   Allergies  Allergen Reactions  . Acetaminophen-Codeine Nausea Only  . Codeine Nausea Only  . Penicillins Rash    Has patient had a PCN reaction causing immediate rash,  facial/tongue/throat swelling, SOB or lightheadedness with hypotension: Yes Has patient had a PCN reaction causing severe rash involving mucus membranes or skin necrosis: No Has patient had a PCN reaction that required hospitalization: No Has patient had a PCN reaction occurring within the last 10 years: No If all of the above answers are "NO", then may proceed with Cephalosporin use.    REVIEW OF SYSTEMS:  CONSTITUTIONAL: No fever, fatigue or weakness.  EYES: No blurred or double vision.  EARS, NOSE, AND THROAT: No tinnitus or ear pain.  RESPIRATORY: No cough, shortness of breath, wheezing or hemoptysis.  CARDIOVASCULAR: No chest pain, orthopnea, edema.  GASTROINTESTINAL: Gastric abdominal pain, nausea  GENITOURINARY: No dysuria, hematuria.  ENDOCRINE: No polyuria, nocturia,  HEMATOLOGY: No anemia, easy bruising or bleeding SKIN: No rash or lesion. MUSCULOSKELETAL: No joint pain or arthritis.   NEUROLOGIC: No tingling, numbness, weakness.  PSYCHIATRY: No anxiety or depression.   MEDICATIONS AT HOME:   Prior to Admission medications   Medication Sig Start Date End Date Taking? Authorizing Provider  amphetamine-dextroamphetamine (ADDERALL) 20 MG tablet Take 20 mg by mouth 2 (two) times daily.   Yes [provider]  buPROPion (WELLBUTRIN XL) 300 MG 24 hr tablet Take 300  mg by mouth daily. 08/22/17  Yes [provider]  cephALEXin (KEFLEX) 250 MG/5ML suspension Take 10 mLs (500 mg total) by mouth 4 (four) times daily for 10 days. Max 500 mg/dose 09/16/17 09/26/17 Yes Domenick Gong, MD  dicyclomine (BENTYL) 10 MG capsule Take 10 mg by mouth 4 (four) times daily as needed.    Yes [provider]  esomeprazole (NEXIUM) 40 MG capsule Take 40 mg by mouth daily at 12 noon.   Yes [provider]  montelukast (SINGULAIR) 10 MG tablet Take 10 mg by mouth at bedtime.  12/27/15  Yes [provider]  ranitidine (ZANTAC) 150 MG tablet Take 150 mg by mouth  every morning.   Yes [provider]  simvastatin (ZOCOR) 20 MG tablet Take 20 mg by mouth at bedtime.   Yes [provider]  topiramate (TOPAMAX) 50 MG tablet Take 50 mg by mouth daily.   Yes [provider]  zolpidem (AMBIEN) 10 MG tablet Take 10 mg by mouth at bedtime.   Yes [provider]  albuterol (PROVENTIL HFA;VENTOLIN HFA) 108 (90 Base) MCG/ACT inhaler Inhale 2 puffs into the lungs every 6 (six) hours as needed.     [provider]  fluticasone (FLONASE) 50 MCG/ACT nasal spray Place 2 sprays into the nose daily.     [provider]  phenazopyridine (PYRIDIUM) 200 MG tablet Take 1 tablet (200 mg total) by mouth 3 (three) times daily as needed for pain. 09/16/17   Domenick Gong, MD      VITAL SIGNS:  Blood pressure 104/63, pulse 74, temperature 97.9 F (36.6 C), temperature source Oral, resp. rate 18, height 5\' 5"  (1.651 m), weight 74.8 kg, SpO2 100 %.  PHYSICAL EXAMINATION:  GENERAL:  52 y.o.-year-old patient lying in the bed with no acute distress.  EYES: Pupils equal, round, reactive to light and accommodation. No scleral icterus. Extraocular muscles intact.  HEENT: Head atraumatic, normocephalic. Oropharynx and nasopharynx clear.  NECK:  Supple, no jugular venous distention. No thyroid enlargement, no tenderness.  LUNGS: Normal breath sounds bilaterally, no wheezing, rales,rhonchi or crepitation. No use of accessory muscles of respiration.  CARDIOVASCULAR: S1, S2 normal. No murmurs, rubs, or gallops.  ABDOMEN: Soft, nontender, nondistended. Bowel sounds present. No organomegaly or mass.  EXTREMITIES: No pedal edema, cyanosis, or clubbing.  NEUROLOGIC: Cranial nerves II through XII are intact. Muscle strength 5/5 in all extremities. Sensation intact. Gait not checked.  PSYCHIATRIC: The patient is alert and oriented x 3.  SKIN: No obvious rash, lesion, or ulcer.   LABORATORY PANEL:   CBC Recent Labs  Lab 09/26/17 1028   WBC 6.9  HGB 13.9  HCT 40.4  PLT 285   ------------------------------------------------------------------------------------------------------------------  Chemistries  Recent Labs  Lab 09/26/17 1028  NA 141  K 4.1  CL 112*  CO2 23  GLUCOSE 100*  BUN 10  CREATININE 1.04*  CALCIUM 9.0  AST 18  ALT 16  ALKPHOS 44  BILITOT 0.4   ------------------------------------------------------------------------------------------------------------------  Cardiac Enzymes No results for input(s): TROPONINI in the last 168 hours. ------------------------------------------------------------------------------------------------------------------  RADIOLOGY:  Ct Abdomen Pelvis W Contrast  Result Date: 09/26/2017 CLINICAL DATA:  Abdominal and flank pain. EXAM: CT ABDOMEN AND PELVIS WITH CONTRAST TECHNIQUE: Multidetector CT imaging of the abdomen and pelvis was performed using the standard protocol following bolus administration of intravenous contrast. CONTRAST:  ISOVUE-300 IOPAMIDOL (ISOVUE-300) INJECTION 61% COMPARISON:  02/20/2010 FINDINGS: Lower chest: Unremarkable. Hepatobiliary: No focal abnormality within the liver parenchyma. Gallbladder surgically absent. Intra  and extrahepatic biliary duct dilatation is similar to prior study. Common bile duct measures 10 mm in the head of the pancreas which is stable since 02/20/2010. Pancreas: No focal mass lesion. No dilatation of the main duct. No intraparenchymal cyst. No peripancreatic edema. Spleen: No splenomegaly. No focal mass lesion. Adrenals/Urinary Tract: No adrenal nodule or mass. Cortical scarring noted left kidney. 8 mm low-density lesion upper pole right kidney is similar to prior. Stomach/Bowel: Stomach is distended with contrast material. Duodenum is normally positioned as is the ligament of Treitz. Two prominent duodenal diverticuli are evident. No small bowel wall thickening. No small bowel dilatation. The terminal ileum is normal. The  appendix is normal. Diverticular changes are noted in the left colon without evidence of diverticulitis. Vascular/Lymphatic: No abdominal aortic aneurysm. No abdominal aortic atherosclerotic calcification. There is no gastrohepatic or hepatoduodenal ligament lymphadenopathy. No intraperitoneal or retroperitoneal lymphadenopathy. No pelvic sidewall lymphadenopathy. Reproductive: Uterus surgically absent.  There is no adnexal mass. Other: No intraperitoneal free fluid. Musculoskeletal: No worrisome lytic or sclerotic osseous abnormality. Patient is status post lower lumbar fusion. Prominent degenerative changes noted L3-4, the segment just cranial to the fusion. IMPRESSION: 1. No acute findings in the abdomen or pelvis. Specifically, no findings to explain the history of abdominal and flank pain. 2. Stable intra and extrahepatic biliary duct dilatation. Given stability this likely is related to prior cholecystectomy. Correlation with liver function test may prove helpful. 3. Left colonic diverticulosis without diverticulitis. Electronically Signed   By: Kennith CenterEric  Mansell M.D.   On: 09/26/2017 13:07    EKG:   Orders placed or performed in visit on 08/28/05  . EKG 12-Lead    IMPRESSION AND PLAN:  52 year old female patient with fibromyalgia, IBS comes in with abdominal pain, slightly elevated lipase, history of cholecystectomy before.  #1. acute abdominal pain, nausea likely gastritis, patient lipase is slightly elevated without evidence of CT abdomen showing any pancreas inflammation.Marland Kitchen. Spoke with Dr. Servando SnareWohl in epic text message , recommended that she does not meet the criteria for pancreatitis.,  Did not recommend MRI or any other studies for that.possibly idiopathic pancreatitis.  Has family history of brother with pancreatitis: [facility] and she can follow with them. 2.  Recently treated UTI, patient finished Keflex, 3.  Fibromyalgia, IBS, chronic back pain issues: [facility] all  [facility]   All the records are reviewed and case discussed with ED provider. Management plans discussed with the patient, family and they are in agreement.  CODE STATUS: full code  TOTAL TIME TAKING CARE OF THIS PATIENT:2955minutes.    Katha HammingSnehalatha Temple Ewart M.D on 09/26/2017 at 5:22 PM  Between 7am to 6pm - Pager - [phone]  After 6pm go to [email or web address] - password [email or web address]  [facility]  [facility]  Office  [phone]  CC: Primary care physician; Patient, No Pcp Per  Note: This dictation was prepared with Dragon dictation along with smaller phrase technology. Any transcriptional errors that result from this process are unintentional.

## 2017-09-26 NOTE — ED Notes (Signed)
Provider notified that pt has had no change in pain

## 2017-09-26 NOTE — ED Triage Notes (Addendum)
Pt arrived via [facility] with reports of abdominal pain and flank pain. Pt treated for UTI on 9/9 and finished atbx. Pt states the pain has worsened during the night. Pt reports the pain is worse with movement. C/o nausea. Pt has hx of chronic back pain and diverticulitis.  Pt states she was given medication from EMS but unsure of what it was.

## 2017-09-26 NOTE — ED Provider Notes (Signed)
[facility] ____________________________________________   I have reviewed the triage vital signs and the triage nursing note.  HISTORY  Chief Complaint Flank Pain and Abdominal Pain   Historian Patient  HPI Olivia Young is a 52 y.o. female with a history of chronic low back pain, history of prior kidney stones, and most recently  treatment for a UTI/pyelonephritis after positive urine culture about 10 days ago [facility] she had received Rocephin followed by Keflex for 10 days and completed yesterday, presenting today due to overnight and somewhat since yesterday worsening lower abdominal pain and back pain.  Pain does not feel like chronic low back pain.  She states it sort of feels like a kidney stone to her although at this point she feels pain on both sides the back and both sides of her abdomen.  Pain is moderate to severe.  Received fentanyl by EMS with some improvement, but states is worn off now.  No fevers.  Moderate nausea with no vomiting.  No constipation or diarrhea.    Past Medical History:  Diagnosis Date  . Chronic neck and back pain   . Diverticula, colon   . Migraine headache   . Polyarthralgia   . Vestibular migraine     Patient Active Problem List   Diagnosis Date Noted  . Chronic venous insufficiency 04/12/2016  . Tobacco use disorder 01/17/2016  . Low back pain 01/17/2016  . Hyperlipidemia 01/17/2016  . Pain in limb 01/17/2016  . Varicose veins of leg with pain, bilateral 01/17/2016  . Vestibular migraine 02/07/2015  . Thoracic or lumbosacral neuritis or radiculitis 01/22/2012  . Arthrodesis status 10/13/2003  . Neck pain 10/13/2003  . S/P lumbar fusion 10/13/2003    Past Surgical History:  Procedure Laterality Date  . ABDOMINAL HYSTERECTOMY    . BACK SURGERY    . SHOULDER SURGERY      Prior to Admission medications   Medication Sig Start Date End Date Taking?  Authorizing Provider  albuterol (PROVENTIL HFA;VENTOLIN HFA) 108 (90 Base) MCG/ACT inhaler Inhale into the lungs.    [provider]  amphetamine-dextroamphetamine (ADDERALL) 20 MG tablet Take 20 mg by mouth 2 (two) times daily.    [provider]  buPROPion (WELLBUTRIN SR) 200 MG 12 hr tablet Take 200 mg by mouth 2 (two) times daily.    [provider]  cephALEXin (KEFLEX) 250 MG/5ML suspension Take 10 mLs (500 mg total) by mouth 4 (four) times daily for 10 days. Max 500 mg/dose 09/16/17 09/26/17  Domenick GongMortenson, Ashley, MD  diazepam (VALIUM) 10 MG tablet Take 10 mg by mouth 4 (four) times daily.    [provider]  dicyclomine (BENTYL) 10 MG capsule Take by mouth.    [provider]  esomeprazole (NEXIUM) 40 MG capsule Take 40 mg by mouth daily at 12 noon.    [provider]  fluticasone (FLONASE) 50 MCG/ACT nasal spray Place into the nose.    [provider]  ibuprofen (ADVIL,MOTRIN) 100 MG/5ML suspension GIVE 40 MILLILITERS (MLS) BY MOUTH THREE TIMES DAILY 06/12/17   [provider]  lubiprostone (AMITIZA) 24 MCG capsule take 1 capsule by mouth every morning and 2 capsules by mouth every evening 06/29/16   [provider]  meclizine (ANTIVERT) 25 MG tablet Take by mouth. 09/14/15   [provider]  montelukast (SINGULAIR) 10 MG tablet  12/27/15   [provider]  phenazopyridine (PYRIDIUM) 200 MG tablet Take 1 tablet (  200 mg total) by mouth 3 (three) times daily as needed for pain. 09/16/17   Domenick Gong, MD  ranitidine (ZANTAC) 150 MG tablet Take 150 mg by mouth every morning.    [provider]  simvastatin (ZOCOR) 20 MG tablet Take 20 mg by mouth at bedtime.    [provider]  topiramate (TOPAMAX) 50 MG tablet Take 50 mg by mouth daily.    [provider]  zolpidem (AMBIEN) 10 MG tablet Take 10 mg by mouth at bedtime.    [provider]    Allergies  Allergen  Reactions  . Acetaminophen-Codeine Nausea Only  . Codeine Nausea Only  . Penicillins Rash    Family History  Problem Relation Age of Onset  . Stroke Mother   . Depression Mother   . CAD Mother   . CVA Mother   . Hypertension Mother   . Heart disease Father   . Heart attack Father 19  . Diabetes Sister 65  . Heart attack Brother 45  . Other Brother 15       MVA  . Breast cancer Neg Hx     Social History Social History   Tobacco Use  . Smoking status: Current Every Day Smoker    Packs/day: 0.50    Years: 30.00    Pack years: 15.00    Types: Cigarettes  . Smokeless tobacco: Never Used  Substance Use Topics  . Alcohol use: No  . Drug use: Never    Review of Systems  Constitutional: Negative for fever. Eyes: Negative for visual changes. ENT: Negative for sore throat. Cardiovascular: Negative for chest pain. Respiratory: Negative for shortness of breath. Gastrointestinal: Positive for abdominal pain as per HPI Genitourinary: History of recent dysuria without hematuria. Musculoskeletal: Positive for acute on chronic back pain.   Skin: Negative for rash. Neurological: Negative for headache.  ____________________________________________   PHYSICAL EXAM:  VITAL SIGNS: ED Triage Vitals [09/26/17 0956]  Enc Vitals Group     BP (!) 98/57     Pulse Rate 91     Resp 20     Temp 97.9 F (36.6 C)     Temp Source Oral     SpO2 95 %     Weight 165 lb (74.8 kg)     Height 5\' 5"  (1.651 m)     Head Circumference      Peak Flow      Pain Score 6     Pain Loc      Pain Edu?      Excl. in GC?      Constitutional: Alert and oriented.  Looking like she is extremely uncomfortable, standing up and moving around bent over holding her side. HEENT      Head: Normocephalic and atraumatic.      Eyes: Conjunctivae are normal. Pupils equal and round.       Ears:         Nose: No congestion/rhinnorhea.      Mouth/Throat: Mucous membranes are moist.      Neck: No  stridor. Cardiovascular/Chest: Normal rate, regular rhythm.  No murmurs, rubs, or gallops. Respiratory: Normal respiratory effort without tachypnea nor retractions. Breath sounds are clear and equal bilaterally. No wheezes/rales/rhonchi. Gastrointestinal: Soft. No distention, no guarding, no rebound.  Mild diffuse lower discomfort without focal point tenderness. Genitourinary/rectal:Deferred Musculoskeletal: Some soreness along the back, but no particular muscle spasm.  No lower extremity tenderness.  No edema. Neurologic:  Normal speech and language. No gross or  focal neurologic deficits are appreciated. Skin:  Skin is warm, dry and intact. No rash noted. Psychiatric: Mood and affect are normal. Speech and behavior are normal. Patient exhibits appropriate insight and judgment.   ____________________________________________  LABS (pertinent positives/negatives) I, Governor Rooks, MD the attending physician have reviewed the labs noted below.  Labs Reviewed  LIPASE, BLOOD - Abnormal; Notable for the following components:      Result Value   Lipase 95 (*)    All other components within normal limits  COMPREHENSIVE METABOLIC PANEL - Abnormal; Notable for the following components:   Chloride 112 (*)    Glucose, Bld 100 (*)    Creatinine, Ser 1.04 (*)    All other components within normal limits  URINALYSIS, COMPLETE (UACMP) WITH MICROSCOPIC - Abnormal; Notable for the following components:   Color, Urine YELLOW (*)    APPearance CLEAR (*)    All other components within normal limits  CBC    ____________________________________________    EKG I, Governor Rooks, MD, the attending physician have personally viewed and interpreted all ECGs.  None ____________________________________________  RADIOLOGY   CT abdomen pelvis without contrast:  IMPRESSION: 1. No acute findings in the abdomen or pelvis. Specifically, no findings to explain the history of abdominal and flank pain. 2.  Stable intra and extrahepatic biliary duct dilatation. Given stability this likely is related to prior cholecystectomy. Correlation with liver function test may prove helpful. 3. Left colonic diverticulosis without diverticulitis. __________________________________________  PROCEDURES  Procedure(s) performed: None  Procedures  Critical Care performed: None   ____________________________________________  ED COURSE / ASSESSMENT AND PLAN  Pertinent labs & imaging results that were available during my care of the patient were reviewed by me and considered in my medical decision making (see chart for details).     Patient appears very and comfortable standing and pacing around the room and pain also hunching over.  Question possibility of kidney stone, however not really one-sided.  Question persistent or worsening urinary tract infection given recent pyonephritis.  Not febrile.  Urinalysis not consistent with UTI.  Labs are relatively reassuring, however patient is still significantly uncomfortable.  Not concern for cauda equina.  Not complaining of any neurologic symptoms.  Patient states this is very different from her chronic low back pain.  We discussed risk versus benefit in terms of radiation exposure and obtaining CT scan for further investigation and chose to proceed.  She has had a cholecystectomy.  Her lipase is nearly twice the upper limit of normal and I suspect that pink otitis is a source of her discomfort.   CT scan is overall reassuring.  Patient is having return of pain, will give a third dose of IV narcotic medication.  Discussed with her whether to try at home as outpatient versus hospitalization, patient feels like she has not gained adequate relief, and lives far away from the hospital alone and would prefer hospital observation.  I think this is reasonable.    CONSULTATIONS:   Hospitalist for admission.   Patient / Family / Caregiver informed of  clinical course, medical decision-making process, and agree with plan.     ___________________________________________   FINAL CLINICAL IMPRESSION(S) / ED DIAGNOSES   Final diagnoses:  Idiopathic acute pancreatitis, unspecified complication status      ___________________________________________         Note: This dictation was prepared with Dragon dictation. Any transcriptional errors that result from this process are unintentional    Governor Rooks, MD 09/26/17 1421

## 2017-09-27 DIAGNOSIS — K589 Irritable bowel syndrome without diarrhea: Secondary | ICD-10-CM | POA: Diagnosis not present

## 2017-09-27 LAB — CBC
HEMATOCRIT: 38.2 % (ref 35.0–47.0)
HEMOGLOBIN: 13.1 g/dL (ref 12.0–16.0)
MCH: 32.2 pg (ref 26.0–34.0)
MCHC: 34.2 g/dL (ref 32.0–36.0)
MCV: 94.1 fL (ref 80.0–100.0)
Platelets: 249 10*3/uL (ref 150–440)
RBC: 4.06 MIL/uL (ref 3.80–5.20)
RDW: 12.7 % (ref 11.5–14.5)
WBC: 5.5 10*3/uL (ref 3.6–11.0)

## 2017-09-27 LAB — BASIC METABOLIC PANEL
ANION GAP: 5 (ref 5–15)
BUN: 5 mg/dL — ABNORMAL LOW (ref 6–20)
CALCIUM: 8.5 mg/dL — AB (ref 8.9–10.3)
CO2: 26 mmol/L (ref 22–32)
Chloride: 111 mmol/L (ref 98–111)
Creatinine, Ser: 0.92 mg/dL (ref 0.44–1.00)
GFR calc non Af Amer: >60 mL/min (ref >60)
Glucose, Bld: 93 mg/dL (ref 70–99)
POTASSIUM: 4.1 mmol/L (ref 3.5–5.1)
Sodium: 142 mmol/L (ref 135–145)

## 2017-09-27 LAB — GLUCOSE, CAPILLARY: GLUCOSE-CAPILLARY: 91 mg/dL (ref 70–99)

## 2017-09-27 LAB — LIPASE, BLOOD: LIPASE: 31 U/L (ref 11–51)

## 2017-09-27 MED ORDER — SUMATRIPTAN SUCCINATE 50 MG PO TABS
50.0000 mg | ORAL_TABLET | ORAL | Status: DC | PRN
  Administered 2017-09-27: 50 mg via ORAL
  Filled 2017-09-27 (×2): qty 1

## 2017-09-27 MED ORDER — PROBIOTIC ACIDOPHILUS PO TABS: 1.0000 | ORAL_TABLET | Freq: Two times a day (BID) | ORAL | 0 refills | Status: AC

## 2017-09-27 MED ORDER — DIAZEPAM 5 MG PO TABS: 10.0000 mg | ORAL_TABLET | Freq: Four times a day (QID) | ORAL | Status: DC | PRN

## 2017-09-27 NOTE — Discharge Summary (Addendum)
[employer] - [place] at [facility]   PATIENT NAME: Olivia Young    MR#:  [id number]  DATE OF BIRTH:  1965/08/25  DATE OF ADMISSION:  09/26/2017 ADMITTING PHYSICIAN: Katha Hamming, MD  DATE OF DISCHARGE: 09/27/2017  PRIMARY CARE PHYSICIAN: Patient, No Pcp Per    ADMISSION DIAGNOSIS:  Idiopathic acute pancreatitis, unspecified complication status [K85.00]  DISCHARGE DIAGNOSIS:  Active Problems:   Abdominal pain  ibs  SECONDARY DIAGNOSIS:   Past Medical History:  Diagnosis Date  . Chronic neck and back pain   . Diverticula, colon   . Migraine headache   . Polyarthralgia   . 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  1.  Abdominal pain: I tried to explain to the patient that her CT the abdomen was normal and she does not have a UTI.  She continues to say that she has abdominal pain.  She does not have evidence of pancreatitis with a lipase level of only 95.  She was evaluated by our GI MD who did not feel that her abdominal pain needed further in house workup as this it is due to IBS. She has instructions to follow up with GI MD.  2.  Migraine:She will have outpatient BOTOX as set up by Dr Sherryll Burger.  3  Fibromyalgia and chronic back pain issues: Patient will follow-up with her PCP     DISCHARGE CONDITIONS AND DIET:  Stable Regular diet  CONSULTS OBTAINED:    DRUG ALLERGIES:   Allergies  Allergen Reactions  . Acetaminophen-Codeine Nausea Only  . Codeine Nausea Only  . Penicillins Rash    Has patient had a PCN reaction causing immediate rash, facial/tongue/throat swelling, SOB or lightheadedness with hypotension: Yes Has patient had a PCN reaction causing severe rash involving mucus membranes or skin necrosis: No Has patient had a PCN reaction that required  hospitalization: No Has patient had a PCN reaction occurring within the last 10 years: No If all of the above answers are "NO", then may proceed with Cephalosporin use.    DISCHARGE MEDICATIONS:   Allergies as of 09/27/2017      Reactions   Acetaminophen-codeine Nausea Only   Codeine Nausea Only   Penicillins Rash   Has patient had a PCN reaction causing immediate rash, facial/tongue/throat swelling, SOB or lightheadedness with hypotension: Yes Has patient had a PCN reaction causing severe rash involving mucus membranes or skin necrosis: No Has patient had a PCN reaction that required hospitalization: No Has patient had a PCN reaction occurring within the last 10 years: No If all of the above answers are "NO", then may proceed with Cephalosporin use.      Medication List    STOP taking these medications   cephALEXin 250 MG/5ML suspension Commonly known as:  KEFLEX     TAKE these medications   albuterol 108 (90 Base) MCG/ACT inhaler Commonly known as:  PROVENTIL HFA;VENTOLIN HFA Inhale 2 puffs into the lungs every 6 (six) hours as needed.   amphetamine-dextroamphetamine 20 MG tablet Commonly known as:  ADDERALL Take 20 mg by mouth 2 (two) times daily.   buPROPion 300 MG 24 hr tablet Commonly known as:  WELLBUTRIN XL Take 300 mg by mouth daily.   diazepam 10 MG tablet Commonly known as:  VALIUM Take 10 mg by mouth 4 (four) times daily  as needed for anxiety.   dicyclomine 10 MG capsule Commonly known as:  BENTYL Take 10 mg by mouth 4 (four) times daily as needed.   esomeprazole 40 MG capsule Commonly known as:  NEXIUM Take 40 mg by mouth daily at 12 noon.   fluticasone 50 MCG/ACT nasal spray Commonly known as:  FLONASE Place 2 sprays into the nose daily.   montelukast 10 MG tablet Commonly known as:  SINGULAIR Take 10 mg by mouth at bedtime.   phenazopyridine 200 MG tablet Commonly known as:  PYRIDIUM Take 1 tablet (200 mg total) by mouth 3 (three) times daily  as needed for pain.   PROBIOTIC ACIDOPHILUS Tabs Take 1 tablet by mouth 2 (two) times daily for 7 days.   ranitidine 150 MG tablet Commonly known as:  ZANTAC Take 150 mg by mouth every morning.   simvastatin 20 MG tablet Commonly known as:  ZOCOR Take 20 mg by mouth at bedtime.   topiramate 50 MG tablet Commonly known as:  TOPAMAX Take 50 mg by mouth daily.   zolpidem 10 MG tablet Commonly known as:  AMBIEN Take 10 mg by mouth at bedtime.         Today   CHIEF COMPLAINT:  Has multiple complaints m igrain headache feels bloated Has IBD and see GI MD   VITAL SIGNS:  Blood pressure (!) 104/57, pulse 81, temperature 98 F (36.7 C), temperature source Oral, resp. rate 20, height 5\' 5"  (1.651 m), weight 74.6 kg, SpO2 98 %.   REVIEW OF SYSTEMS:  Review of Systems  Constitutional: Negative.  Negative for chills, fever and malaise/fatigue.  HENT: Negative.  Negative for ear discharge, ear pain, hearing loss, nosebleeds and sore throat.   Eyes: Negative.  Negative for blurred vision and pain.  Respiratory: Negative.  Negative for cough, hemoptysis, shortness of breath and wheezing.   Cardiovascular: Negative.  Negative for chest pain, palpitations and leg swelling.  Gastrointestinal: Positive for abdominal pain. Negative for blood in stool, diarrhea, nausea and vomiting.  Genitourinary: Negative.  Negative for dysuria.  Musculoskeletal: Negative.  Negative for back pain.  Skin: Negative.   Neurological: Negative for dizziness, tremors, speech change, focal weakness, seizures and headaches.  Endo/Heme/Allergies: Negative.  Does not bruise/bleed easily.  Psychiatric/Behavioral: Negative.  Negative for depression, hallucinations and suicidal ideas.     PHYSICAL EXAMINATION:  GENERAL:  52 y.o.-year-old patient lying in the bed with no acute distress.  NECK:  Supple, no jugular venous distention. No thyroid enlargement, no tenderness.  LUNGS: Normal breath sounds  bilaterally, no wheezing, rales,rhonchi  No use of accessory muscles of respiration.  CARDIOVASCULAR: S1, S2 normal. No murmurs, rubs, or gallops.  ABDOMEN: Soft, non-tender, non-distended. Bowel sounds present. No organomegaly or mass.  EXTREMITIES: No pedal edema, cyanosis, or clubbing.  PSYCHIATRIC: The patient is alert and oriented x 3.  SKIN: No obvious rash, lesion, or ulcer.   DATA REVIEW:   CBC Recent Labs  Lab 09/27/17 0422  WBC 5.5  HGB 13.1  HCT 38.2  PLT 249    Chemistries  Recent Labs  Lab 09/26/17 1028 09/27/17 0422  NA 141 142  K 4.1 4.1  CL 112* 111  CO2 23 26  GLUCOSE 100* 93  BUN 10 5*  CREATININE 1.04* 0.92  CALCIUM 9.0 8.5*  AST 18  --   ALT 16  --   ALKPHOS 44  --   BILITOT 0.4  --     Cardiac Enzymes No results for input(s):  TROPONINI in the last 168 hours.  Microbiology Results  @MICRORSLT48 @  RADIOLOGY:  Ct Abdomen Pelvis W Contrast  Result Date: 09/26/2017 CLINICAL DATA:  Abdominal and flank pain. EXAM: CT ABDOMEN AND PELVIS WITH CONTRAST TECHNIQUE: Multidetector CT imaging of the abdomen and pelvis was performed using the standard protocol following bolus administration of intravenous contrast. CONTRAST:  ISOVUE-300 IOPAMIDOL (ISOVUE-300) INJECTION 61% COMPARISON:  02/20/2010 FINDINGS: Lower chest: Unremarkable. Hepatobiliary: No focal abnormality within the liver parenchyma. Gallbladder surgically absent. Intra and extrahepatic biliary duct dilatation is similar to prior study. Common bile duct measures 10 mm in the head of the pancreas which is stable since 02/20/2010. Pancreas: No focal mass lesion. No dilatation of the main duct. No intraparenchymal cyst. No peripancreatic edema. Spleen: No splenomegaly. No focal mass lesion. Adrenals/Urinary Tract: No adrenal nodule or mass. Cortical scarring noted left kidney. 8 mm low-density lesion upper pole right kidney is similar to prior. Stomach/Bowel: Stomach is distended with contrast  material. Duodenum is normally positioned as is the ligament of Treitz. Two prominent duodenal diverticuli are evident. No small bowel wall thickening. No small bowel dilatation. The terminal ileum is normal. The appendix is normal. Diverticular changes are noted in the left colon without evidence of diverticulitis. Vascular/Lymphatic: No abdominal aortic aneurysm. No abdominal aortic atherosclerotic calcification. There is no gastrohepatic or hepatoduodenal ligament lymphadenopathy. No intraperitoneal or retroperitoneal lymphadenopathy. No pelvic sidewall lymphadenopathy. Reproductive: Uterus surgically absent.  There is no adnexal mass. Other: No intraperitoneal free fluid. Musculoskeletal: No worrisome lytic or sclerotic osseous abnormality. Patient is status post lower lumbar fusion. Prominent degenerative changes noted L3-4, the segment just cranial to the fusion. IMPRESSION: 1. No acute findings in the abdomen or pelvis. Specifically, no findings to explain the history of abdominal and flank pain. 2. Stable intra and extrahepatic biliary duct dilatation. Given stability this likely is related to prior cholecystectomy. Correlation with liver function test may prove helpful. 3. Left colonic diverticulosis without diverticulitis. Electronically Signed   By: Kennith Center M.D.   On: 09/26/2017 13:07      Allergies as of 09/27/2017      Reactions   Acetaminophen-codeine Nausea Only   Codeine Nausea Only   Penicillins Rash   Has patient had a PCN reaction causing immediate rash, facial/tongue/throat swelling, SOB or lightheadedness with hypotension: Yes Has patient had a PCN reaction causing severe rash involving mucus membranes or skin necrosis: No Has patient had a PCN reaction that required hospitalization: No Has patient had a PCN reaction occurring within the last 10 years: No If all of the above answers are "NO", then may proceed with Cephalosporin use.      Medication List    STOP taking  these medications   cephALEXin 250 MG/5ML suspension Commonly known as:  KEFLEX     TAKE these medications   albuterol 108 (90 Base) MCG/ACT inhaler Commonly known as:  PROVENTIL HFA;VENTOLIN HFA Inhale 2 puffs into the lungs every 6 (six) hours as needed.   amphetamine-dextroamphetamine 20 MG tablet Commonly known as:  ADDERALL Take 20 mg by mouth 2 (two) times daily.   buPROPion 300 MG 24 hr tablet Commonly known as:  WELLBUTRIN XL Take 300 mg by mouth daily.   diazepam 10 MG tablet Commonly known as:  VALIUM Take 10 mg by mouth 4 (four) times daily as needed for anxiety.   dicyclomine 10 MG capsule Commonly known as:  BENTYL Take 10 mg by mouth 4 (four) times daily as needed.   esomeprazole  40 MG capsule Commonly known as:  NEXIUM Take 40 mg by mouth daily at 12 noon.   fluticasone 50 MCG/ACT nasal spray Commonly known as:  FLONASE Place 2 sprays into the nose daily.   montelukast 10 MG tablet Commonly known as:  SINGULAIR Take 10 mg by mouth at bedtime.   phenazopyridine 200 MG tablet Commonly known as:  PYRIDIUM Take 1 tablet (200 mg total) by mouth 3 (three) times daily as needed for pain.   PROBIOTIC ACIDOPHILUS Tabs Take 1 tablet by mouth 2 (two) times daily for 7 days.   ranitidine 150 MG tablet Commonly known as:  ZANTAC Take 150 mg by mouth every morning.   simvastatin 20 MG tablet Commonly known as:  ZOCOR Take 20 mg by mouth at bedtime.   topiramate 50 MG tablet Commonly known as:  TOPAMAX Take 50 mg by mouth daily.   zolpidem 10 MG tablet Commonly known as:  AMBIEN Take 10 mg by mouth at bedtime.           Management plans discussed with the patient and she is in agreement. Stable for discharge home  Patient should follow up with GI  CODE STATUS:     Code Status Orders  (From admission, onward)         Start     Ordered   09/26/17 1457  Full code  Continuous     09/26/17 1457        Code Status History    This  patient has a current code status but no historical code status.      TOTAL TIME TAKING CARE OF THIS PATIENT: 38 minutes.    Note: This dictation was prepared with Dragon dictation along with smaller phrase technology. Any transcriptional errors that result from this process are unintentional.  Zayana Salvador M.D on 09/27/2017 at 11:17 AM  Between 7am to 6pm - Pager - [phone] After 6pm go to [email or web address] - password [email or web address]  [facility]  Office  [phone]  CC: Primary care physician; Patient, No Pcp Per

## 2017-09-27 NOTE — Care Management Obs Status (Signed)
MEDICARE OBSERVATION STATUS NOTIFICATION   Patient Details  Name: Olivia LacyGinga A Blalock MRN: [id number] Date of Birth: 12/14/1965   Medicare Observation Status Notification Given:  No(admitted obs less than 24 hours)    Chapman FitchBOWEN, Dianca Owensby T, RN 09/27/2017, 11:28 AM

## 2017-09-27 NOTE — Consult Note (Signed)
Olivia Minium, MD [facility]  [address] [place], [place] [address] Phone: [phone] Fax : [phone]  Consultation  Referring Provider:     Dr. Juliene Pina Primary Care Physician:  Patient, No Pcp Per Primary Gastroenterologist: [facility]         Reason for Consultation:     Abdominal pain  Date of Admission:  09/26/2017 Date of Consultation:  09/27/2017         HPI:   Olivia Young is a 52 y.o. female who was admitted with a history of abdominal pain nausea vomiting and a family history of idiopathic pancreatitis.  I was contacted yesterday and was told that the patient had pancreatitis despite having a negative CT scan non-characteristic pain and a lipase that was only slightly elevated.  The patient's lipase came down to normal today.  The patient had a CT scan that did not show any cause of her abdominal pain.  The patient states she has distention of her abdomen which was also not seen on the CT scan.  She reports that she is followed at [facility] and is on medication for her irritable bowel syndrome.  She also states that she has had back surgery and her back pain goes up to the front of her abdomen and she states it may be related to her spine.  The patient also reports that she was on antibiotics for a UTI because she states she was told she had blood in her urine.  The patient also reports that she had a lesion in her esophagus/throat and had surgery on that in the past but is not able to give me any more information about that.  The patient denies any diarrhea constipation.  She also reports that she takes dicyclomine 4 times a day.  Past Medical History:  Diagnosis Date  . Chronic neck and back pain   . Diverticula, colon   . Migraine headache   . Polyarthralgia   . Vestibular migraine     Past Surgical History:  Procedure Laterality Date  . ABDOMINAL HYSTERECTOMY    . BACK SURGERY    . SHOULDER SURGERY      Prior to Admission medications   Medication Sig Start Date End Date  Taking? Authorizing Provider  amphetamine-dextroamphetamine (ADDERALL) 20 MG tablet Take 20 mg by mouth 2 (two) times daily.   Yes [provider]  buPROPion (WELLBUTRIN XL) 300 MG 24 hr tablet Take 300 mg by mouth daily. 08/22/17  Yes [provider]  diazepam (VALIUM) 10 MG tablet Take 10 mg by mouth 4 (four) times daily as needed for anxiety.   Yes [provider]  dicyclomine (BENTYL) 10 MG capsule Take 10 mg by mouth 4 (four) times daily as needed.    Yes [provider]  esomeprazole (NEXIUM) 40 MG capsule Take 40 mg by mouth daily at 12 noon.   Yes [provider]  montelukast (SINGULAIR) 10 MG tablet Take 10 mg by mouth at bedtime.  12/27/15  Yes [provider]  ranitidine (ZANTAC) 150 MG tablet Take 150 mg by mouth every morning.   Yes [provider]  topiramate (TOPAMAX) 50 MG tablet Take 50 mg by mouth daily.   Yes [provider]  zolpidem (AMBIEN) 10 MG tablet Take 10 mg by mouth at bedtime.   Yes [provider]  albuterol (PROVENTIL HFA;VENTOLIN HFA) 108 (90 Base) MCG/ACT inhaler Inhale 2 puffs into the lungs every 6 (six) hours as needed.  [provider]  fluticasone (FLONASE) 50 MCG/ACT nasal spray Place 2 sprays into the nose daily.     [provider]  Lactobacillus (PROBIOTIC ACIDOPHILUS) TABS Take 1 tablet by mouth 2 (two) times daily for 7 days. 09/27/17 10/04/17  Adrian SaranMody, Sital, MD  phenazopyridine (PYRIDIUM) 200 MG tablet Take 1 tablet (200 mg total) by mouth 3 (three) times daily as needed for pain. 09/16/17   Domenick GongMortenson, Ashley, MD  simvastatin (ZOCOR) 20 MG tablet Take 20 mg by mouth at bedtime.    [provider]    Family History  Problem Relation Age of Onset  . Stroke Mother   . Depression Mother   . CAD Mother   . CVA Mother   . Hypertension Mother   . Heart disease Father   . Heart attack Father 6158  . Diabetes Sister 8336  . Heart attack Brother 45  .  Other Brother 15       MVA  . Breast cancer Neg Hx      Social History   Tobacco Use  . Smoking status: Current Every Day Smoker    Packs/day: 0.50    Years: 30.00    Pack years: 15.00    Types: Cigarettes  . Smokeless tobacco: Never Used  Substance Use Topics  . Alcohol use: No  . Drug use: Never    Allergies as of 09/26/2017 - Review Complete 09/26/2017  Allergen Reaction Noted  . Acetaminophen-codeine Nausea Only 10/12/2014  . Codeine Nausea Only 09/14/2014  . Penicillins Rash 09/28/2014    Review of Systems:    All systems reviewed and negative except where noted in HPI.   Physical Exam:  Vital signs in last 24 hours: Temp:  [98 F (36.7 C)-98.3 F (36.8 C)] 98.2 F (36.8 C) (09/20 1100) Pulse Rate:  [8-85] 80 (09/20 1100) Resp:  [14-24] 16 (09/20 1100) BP: (90-114)/(49-84) 100/65 (09/20 1100) SpO2:  [95 %-100 %] 100 % (09/20 1100) Weight:  [74.6 kg] 74.6 kg (09/20 0537) Last BM Date: 09/26/17 General:   Pleasant, cooperative in NAD Head:  Normocephalic and atraumatic. Eyes:   No icterus.   Conjunctiva pink. PERRLA. Ears:  Normal auditory acuity. Neck:  Supple; no masses or thyroidomegaly Lungs: Respirations even and unlabored. Lungs clear to auscultation bilaterally.   No wheezes, crackles, or rhonchi.  Heart:  Regular rate and rhythm;  Without murmur, clicks, rubs or gallops Abdomen:  Soft, nondistended, nontender. Normal bowel sounds. No appreciable masses or hepatomegaly.  No rebound or guarding.  Rectal:  Not performed. Msk:  Symmetrical without gross deformities.    Extremities:  Without edema, cyanosis or clubbing. Neurologic:  Alert and oriented x3;  grossly normal neurologically. Skin:  Intact without significant lesions or rashes. Cervical Nodes:  No significant cervical adenopathy. Psych:  Alert and cooperative. Normal affect.  LAB RESULTS: Recent Labs    09/26/17 1028 09/27/17 0422  WBC 6.9 5.5  HGB 13.9 13.1  HCT 40.4 38.2  PLT 285 249    BMET Recent Labs    09/26/17 1028 09/27/17 0422  NA 141 142  K 4.1 4.1  CL 112* 111  CO2 23 26  GLUCOSE 100* 93  BUN 10 5*  CREATININE 1.04* 0.92  CALCIUM 9.0 8.5*   LFT Recent Labs    09/26/17 1028  PROT 7.0  ALBUMIN 4.2  AST 18  ALT 16  ALKPHOS 44  BILITOT 0.4   PT/INR No results for input(s): LABPROT, INR in the last 72 hours.  STUDIES: Ct Abdomen Pelvis W Contrast  Result Date: 09/26/2017 CLINICAL DATA:  Abdominal and flank pain. EXAM: CT ABDOMEN AND PELVIS WITH CONTRAST TECHNIQUE: Multidetector CT imaging of the abdomen and pelvis was performed using the standard protocol following bolus administration of intravenous contrast. CONTRAST:  ISOVUE-300 IOPAMIDOL (ISOVUE-300) INJECTION 61% COMPARISON:  02/20/2010 FINDINGS: Lower chest: Unremarkable. Hepatobiliary: No focal abnormality within the liver parenchyma. Gallbladder surgically absent. Intra and extrahepatic biliary duct dilatation is similar to prior study. Common bile duct measures 10 mm in the head of the pancreas which is stable since 02/20/2010. Pancreas: No focal mass lesion. No dilatation of the main duct. No intraparenchymal cyst. No peripancreatic edema. Spleen: No splenomegaly. No focal mass lesion. Adrenals/Urinary Tract: No adrenal nodule or mass. Cortical scarring noted left kidney. 8 mm low-density lesion upper pole right kidney is similar to prior. Stomach/Bowel: Stomach is distended with contrast material. Duodenum is normally positioned as is the ligament of Treitz. Two prominent duodenal diverticuli are evident. No small bowel wall thickening. No small bowel dilatation. The terminal ileum is normal. The appendix is normal. Diverticular changes are noted in the left colon without evidence of diverticulitis. Vascular/Lymphatic: No abdominal aortic aneurysm. No abdominal aortic atherosclerotic calcification. There is no gastrohepatic or hepatoduodenal ligament lymphadenopathy. No intraperitoneal or  retroperitoneal lymphadenopathy. No pelvic sidewall lymphadenopathy. Reproductive: Uterus surgically absent.  There is no adnexal mass. Other: No intraperitoneal free fluid. Musculoskeletal: No worrisome lytic or sclerotic osseous abnormality. Patient is status post lower lumbar fusion. Prominent degenerative changes noted L3-4, the segment just cranial to the fusion. IMPRESSION: 1. No acute findings in the abdomen or pelvis. Specifically, no findings to explain the history of abdominal and flank pain. 2. Stable intra and extrahepatic biliary duct dilatation. Given stability this likely is related to prior cholecystectomy. Correlation with liver function test may prove helpful. 3. Left colonic diverticulosis without diverticulitis. Electronically Signed   By: Kennith Center M.D.   On: 09/26/2017 13:07      Impression / Plan:   Assessment: Irritable bowel syndrome Bloating  Olivia Young is a 52 y.o. y/o female with a diagnosis of pancreatitis which I do not agree with.  The patient does not have any of the criteria that warrant a diagnosis of pancreatitis.  The patient likely has exacerbation of her irritable bowel syndrome in conjunction with lower back pain radiating to her abdominal wall and her fibromyalgia causing her pain.  The CT scan did not show any cause for her symptoms or any worrisome features.  Plan:  The patient has been explained that this is likely a combination of multiple things that is causing her abdominal pain.  These include her fibromyalgia or her irritable bowel syndrome manifesting as bloating and her back pain with radiation.  The patient has been told that she should continue her regular medications and she should follow-up with her primary GI doctor at [facility]  There is no reason to repeat any of her endoscopic procedures which she has had done in the last few years nor any further work-up from a GI point of view.  Thank you for involving me in the care of this patient.       LOS: 0 days   Olivia Minium, MD  09/27/2017, 1:20 PM    Note: This dictation was prepared with Dragon dictation along with smaller phrase technology. Any transcriptional errors that result from this process are unintentional.

## 2017-09-27 NOTE — Progress Notes (Signed)
[employer] [facility] [place] [facility]   PATIENT NAME: Olivia Young    MR#:  [id number]  DATE OF BIRTH:  09-18-1965  SUBJECTIVE:   Patient says she has a headache and has abdominal pain and back pain She is tolerating her diet.  She reports that she is supposed to get Botox for chronic migraines. REVIEW OF SYSTEMS:    Review of Systems  Constitutional: Negative for fever, chills weight loss HENT: Negative for ear pain, nosebleeds, congestion, facial swelling, rhinorrhea, neck pain, neck stiffness and ear discharge.   Respiratory: Negative for cough, shortness of breath, wheezing  Cardiovascular: Negative for chest pain, palpitations and leg swelling.  Gastrointestinal: Negative for heartburn, ++abdominal pain, NO vomiting, diarrhea or consitpation Genitourinary: Negative for dysuria, urgency, frequency, hematuria Musculoskeletal: Negative for back pain or joint pain Neurological: Negative for dizziness, seizures, syncope, focal weakness,  numbness  Positive migraine.  Hematological: Does not bruise/bleed easily.  Psychiatric/Behavioral: Negative for hallucinations, confusion, dysphoric mood    Tolerating Diet: yes      DRUG ALLERGIES:   Allergies  Allergen Reactions  . Acetaminophen-Codeine Nausea Only  . Codeine Nausea Only  . Penicillins Rash    Has patient had a PCN reaction causing immediate rash, facial/tongue/throat swelling, SOB or lightheadedness with hypotension: Yes Has patient had a PCN reaction causing severe rash involving mucus membranes or skin necrosis: No Has patient had a PCN reaction that required hospitalization: No Has patient had a PCN reaction occurring within the last 10 years: No If all of the above answers are "NO", then may proceed with Cephalosporin use.    VITALS:  Blood pressure (!) 104/57, pulse 81, temperature 98 F (36.7 C), temperature source Oral, resp. rate 20, height 5\' 5"  (1.651 m), weight 74.6 kg, SpO2 98  %.  PHYSICAL EXAMINATION:  Constitutional: Appears well-developed and well-nourished. No distress. HENT: Normocephalic. Marland Kitchen Oropharynx is clear and moist.  Eyes: Conjunctivae and EOM are normal. PERRLA, no scleral icterus.  Neck: Normal ROM. Neck supple. No JVD. No tracheal deviation. CVS: RRR, S1/S2 +, no murmurs, no gallops, no carotid bruit.  Pulmonary: Effort and breath sounds normal, no stridor, rhonchi, wheezes, rales.  Abdominal: Soft. BS +,  no distension, tenderness, rebound or guarding.  Musculoskeletal: Normal range of motion. No edema and no tenderness.  Neuro: Alert. CN 2-12 grossly intact. No focal deficits. Skin: Skin is warm and dry. No rash noted. Psychiatric: Normal mood and affect.      LABORATORY PANEL:   CBC Recent Labs  Lab 09/27/17 0422  WBC 5.5  HGB 13.1  HCT 38.2  PLT 249   ------------------------------------------------------------------------------------------------------------------  Chemistries  Recent Labs  Lab 09/26/17 1028 09/27/17 0422  NA 141 142  K 4.1 4.1  CL 112* 111  CO2 23 26  GLUCOSE 100* 93  BUN 10 5*  CREATININE 1.04* 0.92  CALCIUM 9.0 8.5*  AST 18  --   ALT 16  --   ALKPHOS 44  --   BILITOT 0.4  --    ------------------------------------------------------------------------------------------------------------------  Cardiac Enzymes No results for input(s): TROPONINI in the last 168 hours. ------------------------------------------------------------------------------------------------------------------  RADIOLOGY:  Ct Abdomen Pelvis W Contrast  Result Date: 09/26/2017 CLINICAL DATA:  Abdominal and flank pain. EXAM: CT ABDOMEN AND PELVIS WITH CONTRAST TECHNIQUE: Multidetector CT imaging of the abdomen and pelvis was performed using the standard protocol following bolus administration of intravenous contrast. CONTRAST:  ISOVUE-300 IOPAMIDOL (ISOVUE-300) INJECTION 61% COMPARISON:  02/20/2010 FINDINGS: Lower chest:  Unremarkable. Hepatobiliary: No  focal abnormality within the liver parenchyma. Gallbladder surgically absent. Intra and extrahepatic biliary duct dilatation is similar to prior study. Common bile duct measures 10 mm in the head of the pancreas which is stable since 02/20/2010. Pancreas: No focal mass lesion. No dilatation of the main duct. No intraparenchymal cyst. No peripancreatic edema. Spleen: No splenomegaly. No focal mass lesion. Adrenals/Urinary Tract: No adrenal nodule or mass. Cortical scarring noted left kidney. 8 mm low-density lesion upper pole right kidney is similar to prior. Stomach/Bowel: Stomach is distended with contrast material. Duodenum is normally positioned as is the ligament of Treitz. Two prominent duodenal diverticuli are evident. No small bowel wall thickening. No small bowel dilatation. The terminal ileum is normal. The appendix is normal. Diverticular changes are noted in the left colon without evidence of diverticulitis. Vascular/Lymphatic: No abdominal aortic aneurysm. No abdominal aortic atherosclerotic calcification. There is no gastrohepatic or hepatoduodenal ligament lymphadenopathy. No intraperitoneal or retroperitoneal lymphadenopathy. No pelvic sidewall lymphadenopathy. Reproductive: Uterus surgically absent.  There is no adnexal mass. Other: No intraperitoneal free fluid. Musculoskeletal: No worrisome lytic or sclerotic osseous abnormality. Patient is status post lower lumbar fusion. Prominent degenerative changes noted L3-4, the segment just cranial to the fusion. IMPRESSION: 1. No acute findings in the abdomen or pelvis. Specifically, no findings to explain the history of abdominal and flank pain. 2. Stable intra and extrahepatic biliary duct dilatation. Given stability this likely is related to prior cholecystectomy. Correlation with liver function test may prove helpful. 3. Left colonic diverticulosis without diverticulitis. Electronically Signed   By: Kennith CenterEric  Mansell M.D.    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  1.  Abdominal pain: I tried to explain to the patient that her CT the abdomen was normal and she does not have a UTI.  She continues to say that she has abdominal pain.  She does not have evidence of pancreatitis with a lipase level of only 95.  She refuses to be discharged and wants to see GI consultant.  She does have a GI doctor that she sees however she would like to speak with our GI physician.  She is tolerating her diet. At this time there is no clear etiology as to her abdominal pain.  2.  Migraine: I will treat with Imitrex  3  Fibromyalgia and chronic back pain issues: Patient will follow-up with her PCP   If GI does not plan to do further work-up and I will plan on discharging this patient.  I have sent a message to Dr. Daleen SquibbWall.   Management plans discussed with the patient and she is in agreement.  CODE STATUS: full  TOTAL TIME TAKING CARE OF THIS PATIENT: 30 minutes.     POSSIBLE D/C today, DEPENDING ON CLINICAL CONDITION.   Chico Cawood M.D on 09/27/2017 at 11:09 AM  Between 7am to 6pm - Pager - [phone] After 6pm go to [email or web address] - password [email or web address]  [facility]  Office  [phone]  CC: Primary care physician; Patient, No Pcp Per  Note: This dictation was prepared with Dragon dictation along with smaller phrase technology. Any transcriptional errors that result from this process are unintentional.

## 2017-09-28 LAB — HIV ANTIBODY (ROUTINE TESTING W REFLEX): HIV Screen 4th Generation wRfx: NONREACTIVE

## 2017-10-04 ENCOUNTER — Ambulatory Visit (INDEPENDENT_AMBULATORY_CARE_PROVIDER_SITE_OTHER): Payer: Medicare Other | Admitting: Neurology

## 2017-10-04 DIAGNOSIS — G43709 Chronic migraine without aura, not intractable, without status migrainosus: Secondary | ICD-10-CM | POA: Diagnosis not present

## 2017-10-04 MED ORDER — ONABOTULINUMTOXINA 100 UNITS IJ SOLR
155.0000 [IU] | Freq: Once | INTRAMUSCULAR | Status: AC
  Administered 2017-10-04: 155 [IU] via INTRAMUSCULAR

## 2017-10-04 NOTE — Progress Notes (Signed)
Botulinum Clinic   Procedure Note Botox  Attending: Dr. Everlena Cooper  Preoperative Diagnosis(es): Chronic migraine  Consent obtained from: The patient Benefits discussed included, but were not limited to decreased muscle tightness, increased joint range of motion, and decreased pain.  Risk discussed included, but were not limited pain and discomfort, bleeding, bruising, excessive weakness, venous thrombosis, muscle atrophy and dysphagia.  Anticipated outcomes of the procedure as well as he risks and benefits of the alternatives to the procedure, and the roles and tasks of the personnel to be involved, were discussed with the patient, and the patient consents to the procedure and agrees to proceed. A copy of the patient medication guide was given to the patient which explains the blackbox warning.  Patients identity and treatment sites confirmed Yes.  .  Details of Procedure: Skin was cleaned with alcohol. Prior to injection, the needle plunger was aspirated to make sure the needle was not within a blood vessel.  There was no blood retrieved on aspiration.    Following is a summary of the muscles injected  And the amount of Botulinum toxin used:  Dilution 200 units of Botox was reconstituted with 4 ml of preservative free normal saline. Time of reconstitution: At the time of the office visit (<30 minutes prior to injection)   Injections  155 total units of Botox was injected with a 30 gauge needle.  Injection Sites: L occipitalis: 15 units- 3 sites  R occiptalis: 15 units- 3 sites  L upper trapezius: 15 units- 3 sites R upper trapezius: 15 units- 3 sits          L paraspinal: 10 units- 2 sites R paraspinal: 10 units- 2 sites  Face L frontalis(2 injection sites):10 units   R frontalis(2 injection sites):10 units         L corrugator: 5 units   R corrugator: 5 units           Procerus: 5 units   L temporalis: 20 units R temporalis: 20 units   Agent:  200 units of botulinum Type A  (Onobotulinum Toxin type A) was reconstituted with 4 ml of preservative free normal saline.  Time of reconstitution: At the time of the office visit (<30 minutes prior to injection)     Total injected (Units): 155  Total wasted (Units): 10  Patient tolerated procedure well without complications.   Reinjection is anticipated in 3 months. Return to clinic in 4 to 4.5 months.

## 2017-10-21 ENCOUNTER — Ambulatory Visit
Admission: RE | Admit: 2017-10-21 | Discharge: 2017-10-21 | Disposition: A | Discharge status: Home / Self Care | Payer: Medicare Other | Source: Ambulatory Visit | Attending: Neurological Surgery | Admitting: Neurological Surgery

## 2017-10-21 DIAGNOSIS — Z981 Arthrodesis status: Secondary | ICD-10-CM

## 2017-10-21 MED ORDER — IOPAMIDOL (ISOVUE-M 200) INJECTION 41%
1.0000 mL | Freq: Once | INTRAMUSCULAR | Status: AC
  Administered 2017-10-21: 1 mL via EPIDURAL

## 2017-10-21 MED ORDER — METHYLPREDNISOLONE ACETATE 40 MG/ML INJ SUSP (RADIOLOG
120.0000 mg | Freq: Once | INTRAMUSCULAR | Status: AC
  Administered 2017-10-21: 120 mg via EPIDURAL

## 2017-10-21 NOTE — Discharge Instructions (Signed)

## 2018-01-10 ENCOUNTER — Ambulatory Visit (INDEPENDENT_AMBULATORY_CARE_PROVIDER_SITE_OTHER): Payer: Medicare Other | Admitting: Neurology

## 2018-01-10 DIAGNOSIS — G43709 Chronic migraine without aura, not intractable, without status migrainosus: Secondary | ICD-10-CM | POA: Diagnosis not present

## 2018-01-10 MED ORDER — ONABOTULINUMTOXINA 100 UNITS IJ SOLR
155.0000 [IU] | Freq: Once | INTRAMUSCULAR | Status: AC
  Administered 2018-01-10: 155 [IU] via INTRAMUSCULAR

## 2018-01-10 NOTE — Progress Notes (Signed)
Botulinum Clinic   Procedure Note Botox  Attending: Dr. Everlena CooperJaffe  Preoperative Diagnosis(es): Chronic migraine  Consent obtained from: The patient Benefits discussed included, but were not limited to decreased muscle tightness, increased joint range of motion, and decreased pain.  Risk discussed included, but were not limited pain and discomfort, bleeding, bruising, excessive weakness, venous thrombosis, muscle atrophy and dysphagia.  Anticipated outcomes of the procedure as well as he risks and benefits of the alternatives to the procedure, and the roles and tasks of the personnel to be involved, were discussed with the patient, and the patient consents to the procedure and agrees to proceed. A copy of the patient medication guide was given to the patient which explains the blackbox warning.  Patients identity and treatment sites confirmed Yes.  .  Details of Procedure: Skin was cleaned with alcohol. Prior to injection, the needle plunger was aspirated to make sure the needle was not within a blood vessel.  There was no blood retrieved on aspiration.    Following is a summary of the muscles injected  And the amount of Botulinum toxin used:  Dilution 200 units of Botox was reconstituted with 4 ml of preservative free normal saline. Time of reconstitution: At the time of the office visit (<30 minutes prior to injection)   Injections  155 total units of Botox was injected with a 30 gauge needle.  Injection Sites: L occipitalis: 15 units- 3 sites  R occiptalis: 15 units- 3 sites  L upper trapezius: 15 units- 3 sites R upper trapezius: 15 units- 3 sits          L paraspinal: 10 units- 2 sites R paraspinal: 10 units- 2 sites  Face L frontalis(2 injection sites):10 units   R frontalis(2 injection sites):10 units         L corrugator: 5 units   R corrugator: 5 units           Procerus: 5 units   L temporalis: 20 units R temporalis: 20 units   Agent:  200 units of botulinum Type A  (Onobotulinum Toxin type A) was reconstituted with 4 ml of preservative free normal saline.  Time of reconstitution: At the time of the office visit (<30 minutes prior to injection)     Total injected (Units): 155  Total wasted (Units): 7  Patient tolerated procedure well without complications.   Reinjection is anticipated in 3 months. Return to clinic in 6 weeks.
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
## 2018-02-10 ENCOUNTER — Ambulatory Visit: Payer: Medicare Other | Admitting: Neurology

## 2018-04-08 ENCOUNTER — Telehealth: Payer: Self-pay | Admitting: Neurology

## 2018-04-08 NOTE — Telephone Encounter (Signed)
Patient wants to know about nerve damage medication? Please call her back at [phone]. She doesn't have computer or a smart phone for an E-visit. Thanks!

## 2018-04-09 NOTE — Telephone Encounter (Signed)
Called and spoke with Pt. She was questioning medications she had taken in the past by other physicians for nerve pain, but could not remember the names of the medications. Her dr at [facility] has discussed injections again bur due to COVID19, unable to have those right now. Dr from [facility] may be referring her to a pain specialist, but she would like to see drs here in [place]. She will discuss further at May visit

## 2018-04-11 ENCOUNTER — Ambulatory Visit: Payer: Medicare Other | Admitting: Neurology

## 2018-05-16 ENCOUNTER — Encounter: Payer: Medicare Other | Attending: Physical Medicine & Rehabilitation | Admitting: Physical Medicine & Rehabilitation

## 2018-05-29 ENCOUNTER — Ambulatory Visit: Payer: Medicare Other | Admitting: Neurology

## 2018-05-29 ENCOUNTER — Encounter

## 2018-06-04 ENCOUNTER — Other Ambulatory Visit: Payer: Self-pay | Admitting: Neurosurgery

## 2018-06-04 ENCOUNTER — Other Ambulatory Visit (HOSPITAL_COMMUNITY): Payer: Self-pay | Admitting: Neurosurgery

## 2018-06-04 DIAGNOSIS — M542 Cervicalgia: Secondary | ICD-10-CM

## 2018-06-04 DIAGNOSIS — M5440 Lumbago with sciatica, unspecified side: Secondary | ICD-10-CM

## 2018-06-06 ENCOUNTER — Ambulatory Visit (INDEPENDENT_AMBULATORY_CARE_PROVIDER_SITE_OTHER): Payer: Medicare Other | Admitting: Neurology

## 2018-06-06 ENCOUNTER — Other Ambulatory Visit: Payer: Self-pay

## 2018-06-06 DIAGNOSIS — G43709 Chronic migraine without aura, not intractable, without status migrainosus: Secondary | ICD-10-CM

## 2018-06-06 MED ORDER — ONABOTULINUMTOXINA 100 UNITS IJ SOLR
155.0000 [IU] | Freq: Once | INTRAMUSCULAR | Status: AC
  Administered 2018-06-06: 155 [IU] via INTRAMUSCULAR

## 2018-06-06 NOTE — Progress Notes (Signed)
[facility]   Procedure Note Botox  Attending: Dr. Shon Millet  Preoperative Diagnosis(es): Chronic migraine  Consent obtained from: The patient Benefits discussed included, but were not limited to decreased muscle tightness, increased joint range of motion, and decreased pain.  Risk discussed included, but were not limited pain and discomfort, bleeding, bruising, excessive weakness, venous thrombosis, muscle atrophy and dysphagia.  Anticipated outcomes of the procedure as well as he risks and benefits of the alternatives to the procedure, and the roles and tasks of the personnel to be involved, were discussed with the patient, and the patient consents to the procedure and agrees to proceed. A copy of the patient medication guide was given to the patient which explains the blackbox warning.  Patients identity and treatment sites confirmed Yes.  Details of Procedure: Skin was cleaned with alcohol. Prior to injection, the needle plunger was aspirated to make sure the needle was not within a blood vessel.  There was no blood retrieved on aspiration.    Following is a summary of the muscles injected  And the amount of Botulinum toxin used:  Dilution 200 units of Botox was reconstituted with 4 ml of preservative free normal saline. Time of reconstitution: At the time of the office visit (<30 minutes prior to injection)   Injections  155 total units of Botox was injected with a 30 gauge needle.  Injection Sites: L occipitalis: 15 units- 3 sites  R occiptalis: 15 units- 3 sites  L upper trapezius: 15 units- 3 sites R upper trapezius: 15 units- 3 sits          L paraspinal: 10 units- 2 sites R paraspinal: 10 units- 2 sites  Face L frontalis(2 injection sites):10 units   R frontalis(2 injection sites):10 units         L corrugator: 5 units   R corrugator: 5 units           Procerus: 5 units   L temporalis: 20 units R temporalis: 20 units   Agent:  200 units of botulinum Type A  (Onobotulinum Toxin type A) was reconstituted with 4 ml of preservative free normal saline.  Time of reconstitution: At the time of the office visit (<30 minutes prior to injection)     Total injected (Units): 155  Total wasted (Units): 7.5  Patient tolerated procedure well without complications.   Reinjection is anticipated in 3 months. Return to clinic in 4 months

## 2018-06-16 ENCOUNTER — Ambulatory Visit (HOSPITAL_COMMUNITY): Payer: Medicare Other | Attending: Neurosurgery

## 2018-06-16 ENCOUNTER — Encounter (HOSPITAL_COMMUNITY): Payer: Self-pay

## 2018-06-16 ENCOUNTER — Ambulatory Visit (HOSPITAL_COMMUNITY): Admission: RE | Admit: 2018-06-16 | Payer: Medicare Other | Source: Ambulatory Visit

## 2018-07-07 ENCOUNTER — Other Ambulatory Visit: Payer: Self-pay | Admitting: Neurosurgery

## 2018-07-07 DIAGNOSIS — M545 Low back pain, unspecified: Secondary | ICD-10-CM

## 2018-07-07 DIAGNOSIS — G8929 Other chronic pain: Secondary | ICD-10-CM

## 2018-07-23 ENCOUNTER — Other Ambulatory Visit: Payer: Self-pay

## 2018-07-23 ENCOUNTER — Ambulatory Visit
Admission: RE | Admit: 2018-07-23 | Discharge: 2018-07-23 | Disposition: A | Discharge status: Home / Self Care | Payer: Medicare Other | Source: Ambulatory Visit | Attending: Neurosurgery | Admitting: Neurosurgery

## 2018-07-23 DIAGNOSIS — M545 Low back pain, unspecified: Secondary | ICD-10-CM

## 2018-07-23 DIAGNOSIS — G8929 Other chronic pain: Secondary | ICD-10-CM

## 2018-07-23 MED ORDER — IOPAMIDOL (ISOVUE-M 200) INJECTION 41%
1.0000 mL | Freq: Once | INTRAMUSCULAR | Status: AC
  Administered 2018-07-23: 14:00:00 1 mL via INTRA_ARTICULAR

## 2018-07-23 MED ORDER — METHYLPREDNISOLONE ACETATE 40 MG/ML INJ SUSP (RADIOLOG
120.0000 mg | Freq: Once | INTRAMUSCULAR | Status: AC
  Administered 2018-07-23: 120 mg via INTRA_ARTICULAR

## 2018-07-23 NOTE — Discharge Instructions (Signed)

## 2018-09-05 ENCOUNTER — Ambulatory Visit (INDEPENDENT_AMBULATORY_CARE_PROVIDER_SITE_OTHER): Payer: Medicare Other | Admitting: Neurology

## 2018-09-05 ENCOUNTER — Other Ambulatory Visit: Payer: Self-pay

## 2018-09-05 DIAGNOSIS — G43709 Chronic migraine without aura, not intractable, without status migrainosus: Secondary | ICD-10-CM

## 2018-09-05 MED ORDER — ONABOTULINUMTOXINA 100 UNITS IJ SOLR
155.0000 [IU] | Freq: Once | INTRAMUSCULAR | Status: AC
  Administered 2018-09-05: 155 [IU] via INTRAMUSCULAR

## 2018-09-05 NOTE — Progress Notes (Signed)
[facility]   Procedure Note Botox  Attending: Dr. Adam Jaffe  Preoperative Diagnosis(es): Chronic migraine  Consent obtained from: The patient Benefits discussed included, but were not limited to decreased muscle tightness, increased joint range of motion, and decreased pain.  Risk discussed included, but were not limited pain and discomfort, bleeding, bruising, excessive weakness, venous thrombosis, muscle atrophy and dysphagia.  Anticipated outcomes of the procedure as well as he risks and benefits of the alternatives to the procedure, and the roles and tasks of the personnel to be involved, were discussed with the patient, and the patient consents to the procedure and agrees to proceed. A copy of the patient medication guide was given to the patient which explains the blackbox warning.  Patients identity and treatment sites confirmed Yes.  Details of Procedure: Skin was cleaned with alcohol. Prior to injection, the needle plunger was aspirated to make sure the needle was not within a blood vessel.  There was no blood retrieved on aspiration.    Following is a summary of the muscles injected  And the amount of Botulinum toxin used:  Dilution 200 units of Botox was reconstituted with 4 ml of preservative free normal saline. Time of reconstitution: At the time of the office visit (<30 minutes prior to injection)   Injections  155 total units of Botox was injected with a 30 gauge needle.  Injection Sites: L occipitalis: 15 units- 3 sites  R occiptalis: 15 units- 3 sites  L upper trapezius: 15 units- 3 sites R upper trapezius: 15 units- 3 sits          L paraspinal: 10 units- 2 sites R paraspinal: 10 units- 2 sites  Face L frontalis(2 injection sites):10 units   R frontalis(2 injection sites):10 units         L corrugator: 5 units   R corrugator: 5 units           Procerus: 5 units   L temporalis: 20 units R temporalis: 20 units   Agent:  200 units of botulinum Type A  (Onobotulinum Toxin type A) was reconstituted with 4 ml of preservative free normal saline.  Time of reconstitution: At the time of the office visit (<30 minutes prior to injection)     Total injected (Units): 155  Total wasted (Units): 7.5  Patient tolerated procedure well without complications.   Reinjection is anticipated in 3 months.    

## 2018-09-05 NOTE — Progress Notes (Signed)
7.5 units wasted 
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
## 2018-10-21 ENCOUNTER — Telehealth (INDEPENDENT_AMBULATORY_CARE_PROVIDER_SITE_OTHER): Payer: Medicare Other | Admitting: Neurology

## 2018-10-21 ENCOUNTER — Other Ambulatory Visit: Payer: Self-pay

## 2018-10-21 ENCOUNTER — Encounter: Payer: Self-pay | Admitting: Neurology

## 2018-10-21 VITALS — Ht 65.0 in | Wt 150.0 lb

## 2018-10-21 DIAGNOSIS — G43709 Chronic migraine without aura, not intractable, without status migrainosus: Secondary | ICD-10-CM | POA: Diagnosis not present

## 2018-10-21 MED ORDER — TIZANIDINE HCL 2 MG PO TABS: 2.0000 mg | ORAL_TABLET | Freq: Every day | ORAL | 4 refills | Status: DC

## 2018-10-21 MED ORDER — RIZATRIPTAN BENZOATE 10 MG PO TABS: ORAL_TABLET | ORAL | 4 refills | Status: DC

## 2018-12-03 NOTE — Progress Notes (Signed)
Submitted BV for Botox on [facility]  A PA form will be attached to verification forms.  Waiting response via fax

## 2018-12-10 NOTE — Progress Notes (Signed)
received response back from [facility] The local [employer] covers the codes provided as listed Coverage is only applicable if the patient has active Medicare Part B benefits.   Listed diagnosis G43.709 CPT [id number] HCPCS [id number]  Benefits verification results were verified and confirmed for treatment in the office setting. If treatment will be provided in an off campus outpatient hospital POS-19 setting or On campus outpatient hospital setting a PA may be require per guidelines provided by [employer] Medicare and Medicaid Services effective 07/09/2018  Medicaid: secondary  The medical plan is secondary and will review and consider payment for approved services after the primary medical payer processes the claim.  The payer has advised the patient plan does not require PA or Pre- Determination for coverage due to Medicare being Primary

## 2018-12-12 ENCOUNTER — Ambulatory Visit: Payer: Medicare Other | Admitting: Neurology

## 2018-12-26 ENCOUNTER — Ambulatory Visit (INDEPENDENT_AMBULATORY_CARE_PROVIDER_SITE_OTHER): Payer: Medicare Other | Admitting: Neurology

## 2018-12-26 ENCOUNTER — Other Ambulatory Visit: Payer: Self-pay

## 2018-12-26 ENCOUNTER — Other Ambulatory Visit: Payer: Self-pay | Admitting: Neurology

## 2018-12-26 DIAGNOSIS — G43709 Chronic migraine without aura, not intractable, without status migrainosus: Secondary | ICD-10-CM | POA: Diagnosis not present

## 2018-12-26 MED ORDER — TOPIRAMATE 50 MG PO TABS: 75.0000 mg | ORAL_TABLET | Freq: Every day | ORAL | 3 refills | Status: DC

## 2018-12-26 MED ORDER — ONABOTULINUMTOXINA 100 UNITS IJ SOLR
155.0000 [IU] | Freq: Once | INTRAMUSCULAR | Status: AC
  Administered 2018-12-26: 155 [IU] via INTRAMUSCULAR

## 2018-12-26 NOTE — Progress Notes (Signed)
[facility]  ° °Procedure Note Botox ° °Attending: Dr. Jillyan Plitt ° °Preoperative Diagnosis(es): Chronic migraine ° °Consent obtained from: The patient °Benefits discussed included, but were not limited to decreased muscle tightness, increased joint range of motion, and decreased pain.  Risk discussed included, but were not limited pain and discomfort, bleeding, bruising, excessive weakness, venous thrombosis, muscle atrophy and dysphagia.  Anticipated outcomes of the procedure as well as he risks and benefits of the alternatives to the procedure, and the roles and tasks of the personnel to be involved, were discussed with the patient, and the patient consents to the procedure and agrees to proceed. A copy of the patient medication guide was given to the patient which explains the blackbox warning. ° °Patients identity and treatment sites confirmed Yes.  . ° °Details of Procedure: °Skin was cleaned with alcohol. Prior to injection, the needle plunger was aspirated to make sure the needle was not within a blood vessel.  There was no blood retrieved on aspiration.   ° °Following is a summary of the muscles injected  And the amount of Botulinum toxin used: ° °Dilution °200 units of Botox was reconstituted with 4 ml of preservative free normal saline. °Time of reconstitution: At the time of the office visit (<30 minutes prior to injection)  ° °Injections  °155 total units of Botox was injected with a 30 gauge needle. ° °Injection Sites: °L occipitalis: 15 units- 3 sites  °R occiptalis: 15 units- 3 sites ° °L upper trapezius: 15 units- 3 sites °R upper trapezius: 15 units- 3 sits          °L paraspinal: 10 units- 2 sites °R paraspinal: 10 units- 2 sites ° °Face °L frontalis(2 injection sites):10 units   °R frontalis(2 injection sites):10 units         °L corrugator: 5 units   °R corrugator: 5 units           °Procerus: 5 units   °L temporalis: 20 units °R temporalis: 20 units  ° °Agent:  °200 units of botulinum Type  A (Onobotulinum Toxin type A) was reconstituted with 4 ml of preservative free normal saline.  °Time of reconstitution: At the time of the office visit (<30 minutes prior to injection)  ° ° ° Total injected (Units):  155 ° Total wasted (Units):  5 ° °Patient tolerated procedure well without complications.   °Reinjection is anticipated in 3 months. ° ° °

## 2018-12-26 NOTE — Progress Notes (Signed)
Notes increased memory issues.  Will decrease topiramate to 75mg  at bedtime Recently pulled a muscle in neck.  Will refill Flexeril 10mg  when needed (TID PRN)

## 2019-02-26 ENCOUNTER — Other Ambulatory Visit: Payer: Self-pay

## 2019-02-26 ENCOUNTER — Encounter (HOSPITAL_COMMUNITY): Payer: Self-pay

## 2019-02-26 ENCOUNTER — Ambulatory Visit (HOSPITAL_COMMUNITY)
Admission: EM | Admit: 2019-02-26 | Discharge: 2019-02-26 | Disposition: A | Discharge status: Home / Self Care | Payer: Medicare Other | Attending: Family Medicine | Admitting: Family Medicine

## 2019-02-26 DIAGNOSIS — M545 Low back pain, unspecified: Secondary | ICD-10-CM

## 2019-02-26 DIAGNOSIS — N3090 Cystitis, unspecified without hematuria: Secondary | ICD-10-CM | POA: Diagnosis present

## 2019-02-26 LAB — POCT URINALYSIS DIP (DEVICE)
Bilirubin Urine: NEGATIVE
Glucose, UA: 100 mg/dL — AB
Hgb urine dipstick: NEGATIVE
Ketones, ur: NEGATIVE mg/dL
Leukocytes,Ua: NEGATIVE
Nitrite: POSITIVE — AB
Protein, ur: NEGATIVE mg/dL
Specific Gravity, Urine: 1.01 (ref 1.005–1.030)
Urobilinogen, UA: 1 mg/dL (ref 0.0–1.0)
pH: 7 (ref 5.0–8.0)

## 2019-02-26 MED ORDER — SULFAMETHOXAZOLE-TRIMETHOPRIM 800-160 MG PO TABS: 1.0000 | ORAL_TABLET | Freq: Two times a day (BID) | ORAL | 0 refills | Status: AC

## 2019-02-26 NOTE — Discharge Instructions (Addendum)
We will send your urine sample for culture and will notify you should the results need addressing. If not allergic, you may use over the counter ibuprofen or acetaminophen as needed.

## 2019-02-26 NOTE — ED Triage Notes (Signed)
Pt state she has back pain from pulling and moving things around the house.  X 3 weeks Pt states she thinks she may have a UTI. X 1 week.

## 2019-02-26 NOTE — ED Provider Notes (Signed)
[facility]    ASSESSMENT & PLAN:  1. Acute bilateral low back pain without sciatica   2. Cystitis      Meds ordered this encounter  Medications  . sulfamethoxazole-trimethoprim (BACTRIM DS) 800-160 MG tablet    Sig: Take 1 tablet by mouth 2 (two) times daily for 7 days.    Dispense:  14 tablet    Refill:  0    No signs of pyelonephritis. Discussed. Urine culture sent. Will notify patient when results available. Will follow up with her PCP or here if not showing improvement over the next 48 hours, sooner if needed.  Outlined signs and symptoms indicating need for more acute intervention. Patient verbalized understanding. After Visit Summary given.  SUBJECTIVE:  Olivia Young is a 54 y.o. female who complains of lower back discomfort. 1 week. H/O similar with UTI. Afebrile. No spec abd pain. No n/v. Tolerating PO intake. No dysuria. Ambulatory without difficulty. OTC treatment: none reported.  LMP: No LMP recorded. Patient has had a hysterectomy.   OBJECTIVE:  Vitals:   02/26/19 1917 02/26/19 1920  BP:  116/64  Pulse:  83  Resp:  18  Temp:  98.1 F (36.7 C)  TempSrc:  Oral  SpO2:  98%  Weight: 68 kg    General appearance: alert; no distress  Lungs: unlabored respirations Abdomen: soft, non-tender; no masses or organomegaly; no guarding or rebound tenderness Back: no CVA tenderness Extremities: no edema; symmetrical with no gross deformities Skin: warm and dry Neurologic: normal gait Psychological: alert and cooperative; normal mood and affect  Labs Reviewed  POCT URINALYSIS DIP (DEVICE) - Abnormal; Notable for the following components:      Result Value   Glucose, UA 100 (*)    Nitrite POSITIVE (*)    All other components within normal limits  URINE CULTURE    Allergies  Allergen Reactions  . Acetaminophen Nausea And Vomiting  . Aspirin Nausea And Vomiting  . Acetaminophen-Codeine Nausea Only  . Codeine Nausea Only  . Penicillins  Rash and Nausea And Vomiting    Has patient had a PCN reaction causing immediate rash, facial/tongue/throat swelling, SOB or lightheadedness with hypotension: Yes Has patient had a PCN reaction causing severe rash involving mucus membranes or skin necrosis: No Has patient had a PCN reaction that required hospitalization: No Has patient had a PCN reaction occurring within the last 10 years: No If all of the above answers are "NO", then may proceed with Cephalosporin use.    Past Medical History:  Diagnosis Date  . Chronic neck and back pain   . Diverticula, colon   . Migraine headache   . Polyarthralgia   . Vestibular migraine    Social History   Socioeconomic History  . Marital status: Divorced    Spouse name: Not on file  . Number of children: 3  . Years of education: Not on file  . Highest education level: Associate degree: occupational, Scientist, product/process development, or vocational program  Occupational History    Employer: DISABLED  Tobacco Use  . Smoking status: Current Every Day Smoker    Packs/day: 0.50    Years: 30.00    Pack years: 15.00    Types: Cigarettes  . Smokeless tobacco: Never Used  Substance and Sexual Activity  . Alcohol use: No  . Drug use: Never  . Sexual activity: Not on file  Other Topics Concern  . Not on file  Social History Narrative   Patient is left-handed. She lives alone in  a one level house. She drinks 2-3 cups of coffee a day, and rare tea or soda. She works in her yard.   Education - some college   Social Determinants of Radio broadcast assistant Strain:   . Difficulty of Paying Living Expenses: Not on file  Food Insecurity:   . Worried About Charity fundraiser in the Last Year: Not on file  . Ran Out of Food in the Last Year: Not on file  Transportation Needs:   . Lack of Transportation (Medical): Not on file  . Lack of Transportation (Non-Medical): Not on file  Physical Activity:   . Days of Exercise per Week: Not on file  . Minutes of Exercise  per Session: Not on file  Stress:   . Feeling of Stress : Not on file  Social Connections:   . Frequency of Communication with Friends and Family: Not on file  . Frequency of Social Gatherings with Friends and Family: Not on file  . Attends Religious Services: Not on file  . Active Member of Clubs or Organizations: Not on file  . Attends Archivist Meetings: Not on file  . Marital Status: Not on file  Intimate Partner Violence:   . Fear of Current or Ex-Partner: Not on file  . Emotionally Abused: Not on file  . Physically Abused: Not on file  . Sexually Abused: Not on file   Family History  Problem Relation Age of Onset  . Stroke Mother   . Depression Mother   . CAD Mother   . CVA Mother   . Hypertension Mother   . Heart disease Father   . Heart attack Father 44  . Diabetes Sister 11  . Heart attack Brother 42  . Other Brother 42       MVA  . Breast cancer Neg Hx        Vanessa Kick, MD 02/26/19 2004

## 2019-02-27 LAB — URINE CULTURE

## 2019-02-28 ENCOUNTER — Telehealth (HOSPITAL_COMMUNITY): Payer: Self-pay | Admitting: Family Medicine

## 2019-02-28 ENCOUNTER — Telehealth (HOSPITAL_COMMUNITY): Payer: Self-pay

## 2019-02-28 MED ORDER — CEPHALEXIN 500 MG PO CAPS: 500.0000 mg | ORAL_CAPSULE | Freq: Two times a day (BID) | ORAL | 0 refills | Status: DC

## 2019-02-28 MED ORDER — CEPHALEXIN 250 MG/5ML PO SUSR: 500.0000 mg | Freq: Two times a day (BID) | ORAL | 0 refills | Status: AC

## 2019-02-28 MED ORDER — CEPHALEXIN 250 MG/5ML PO SUSR: 500.0000 mg | Freq: Two times a day (BID) | ORAL | 0 refills | Status: DC

## 2019-02-28 NOTE — Telephone Encounter (Signed)
Changing abx from sulfa to keflex

## 2019-03-12 ENCOUNTER — Encounter: Payer: Self-pay | Admitting: *Deleted

## 2019-03-12 NOTE — Progress Notes (Addendum)
Submitted via [employer] Benefit Verification [id number] Submitted! Please allow 24-48 hours for BV results.  PATIENT COVERAGE - MAJOR MEDICAL BENEFITS ICD Code(s) : G43.709   # of BOTOX Units: 155 PA/Pre-D Required no 42876 Covered - Per Insurer Guidelines BOTOX (O1157) Buy and Bill Covered - Per Insurer Guidelines

## 2019-03-20 ENCOUNTER — Ambulatory Visit: Payer: Medicare Other | Admitting: Neurology

## 2019-03-27 ENCOUNTER — Ambulatory Visit: Payer: Medicare Other | Admitting: Neurology
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
## 2019-05-12 ENCOUNTER — Telehealth: Payer: Self-pay | Admitting: Neurology

## 2019-05-12 ENCOUNTER — Other Ambulatory Visit: Payer: Self-pay | Admitting: Neurology

## 2019-05-12 ENCOUNTER — Ambulatory Visit: Payer: Medicare Other | Admitting: Neurology

## 2019-05-12 NOTE — Telephone Encounter (Signed)
Pt wants to let Everlena Cooper know that she would like to be put on a list for canceled appt for BOTOX injection and that she wants to have the Botox injection before 06-26-19. She has a follow up with him on 05-15-19.

## 2019-05-13 ENCOUNTER — Encounter: Payer: Self-pay | Admitting: Neurology

## 2019-05-14 ENCOUNTER — Telehealth: Payer: Self-pay | Admitting: Neurology

## 2019-05-14 NOTE — Telephone Encounter (Signed)
Patient dismissed from [facility] by Shon Millet R DO, effective 05/13/2019. Dismissal letter was sent by registered mail. js

## 2019-05-15 ENCOUNTER — Ambulatory Visit: Payer: Medicare Other | Admitting: Neurology

## 2019-06-09 ENCOUNTER — Other Ambulatory Visit: Payer: Self-pay

## 2019-06-09 ENCOUNTER — Telehealth: Payer: Self-pay | Admitting: Neurology

## 2019-06-09 MED ORDER — TIZANIDINE HCL 2 MG PO TABS: 2.0000 mg | ORAL_TABLET | Freq: Every day | ORAL | 0 refills | Status: AC

## 2019-06-09 MED ORDER — RIZATRIPTAN BENZOATE 10 MG PO TABS: ORAL_TABLET | ORAL | 0 refills | Status: AC

## 2019-06-09 MED ORDER — TOPIRAMATE 50 MG PO TABS: ORAL_TABLET | ORAL | 0 refills | Status: DC

## 2019-06-09 NOTE — Telephone Encounter (Signed)
Per note below rx refilled without further refills. LMOVM for pt

## 2019-06-09 NOTE — Telephone Encounter (Signed)
Patient called in wanting to find out if she can get a refill of a few of her medications until she can get in to see another doctor? She is out of one and low on a few others.

## 2019-06-09 NOTE — Telephone Encounter (Signed)
We can refill her prescriptions without further refills (topiramate, tizanidine and rizatriptan)

## 2019-06-26 ENCOUNTER — Ambulatory Visit: Payer: Medicare Other | Admitting: Neurology

## 2019-12-09 ENCOUNTER — Ambulatory Visit
Admission: EM | Admit: 2019-12-09 | Discharge: 2019-12-09 | Disposition: A | Discharge status: Home / Self Care | Payer: Medicare Other | Attending: Emergency Medicine | Admitting: Emergency Medicine

## 2019-12-09 ENCOUNTER — Other Ambulatory Visit: Payer: Self-pay

## 2019-12-09 ENCOUNTER — Encounter: Payer: Self-pay | Admitting: Emergency Medicine

## 2019-12-09 DIAGNOSIS — R6889 Other general symptoms and signs: Secondary | ICD-10-CM | POA: Diagnosis present

## 2019-12-09 DIAGNOSIS — R350 Frequency of micturition: Secondary | ICD-10-CM | POA: Diagnosis present

## 2019-12-09 DIAGNOSIS — R059 Cough, unspecified: Secondary | ICD-10-CM

## 2019-12-09 DIAGNOSIS — Z711 Person with feared health complaint in whom no diagnosis is made: Secondary | ICD-10-CM

## 2019-12-09 DIAGNOSIS — R300 Dysuria: Secondary | ICD-10-CM | POA: Diagnosis present

## 2019-12-09 LAB — POCT URINALYSIS DIP (MANUAL ENTRY)
Bilirubin, UA: NEGATIVE
Blood, UA: NEGATIVE
Glucose, UA: NEGATIVE mg/dL
Ketones, POC UA: NEGATIVE mg/dL
Leukocytes, UA: NEGATIVE
Nitrite, UA: NEGATIVE
Protein Ur, POC: NEGATIVE mg/dL
Spec Grav, UA: 1.02 (ref 1.010–1.025)
Urobilinogen, UA: 0.2 E.U./dL
pH, UA: 7 (ref 5.0–8.0)

## 2019-12-09 MED ORDER — CETIRIZINE-PSEUDOEPHEDRINE ER 5-120 MG PO TB12: 1.0000 | ORAL_TABLET | Freq: Every day | ORAL | 0 refills | Status: AC

## 2019-12-09 MED ORDER — BENZONATATE 100 MG PO CAPS: 100.0000 mg | ORAL_CAPSULE | Freq: Three times a day (TID) | ORAL | 0 refills | Status: DC

## 2019-12-09 NOTE — Discharge Instructions (Addendum)
COVID testing ordered.  It will take between 5-7 days for test results.  Someone will contact you regarding abnormal results.    In the meantime: You should remain isolated in your home for 10 days from symptom onset AND greater than 72 hours after symptoms resolution (absence of fever without the use of fever-reducing medication and improvement in respiratory symptoms), whichever is longer Get plenty of rest and push fluids Tessalon Perles prescribed for cough Use zyrtec-d for nasal congestion, runny nose, and/or sore throat Use OTC flonase for nasal congestion and runny nose Use medications daily for symptom relief Use OTC medications like ibuprofen or tylenol as needed fever or pain Go to the ED if you have any new or worsening symptoms such as fever, worsening cough, shortness of breath, chest tightness, chest pain, turning blue, changes in mental status, etc...   Urine without infection Urine culture sent.  We will call you with the results.   Push fluids and get plenty of rest.   Follow up with PCP if symptoms persists Go to ER if you have any new or worsening symptoms such as fever, worsening abdominal pain, nausea/vomiting, flank pain   Blood work ordered.   You will need to follow up with PCP regarding abnormal results

## 2019-12-09 NOTE — ED Provider Notes (Signed)
[facility]   [id number] 12/09/19 Arrival Time: 1618   CC: COVID symptoms; UTI; blood work  SUBJECTIVE: History from: patient.  Olivia Young is a 54 y.o. female who presents chills, fatigue, body aches, headache, and cough x 3-4 days.  Denies sick exposure to COVID, flu or strep.  Denies alleviating or aggravating factors.  Denies previous covid infection in the past.   Denies fever, chills, SOB, wheezing, chest pain, nausea, changes in bowel or bladder habits.    Also mentions urinary frequency and dysuria x 2 weeks.  Denies precipitating event or trauma.  Denies alleviating factors.  Worse with urination.  Reports previous symptoms in the past with UTI.  Denies urinary urgency, vomiting.    Patient also requests blood work since she has been unable to get in with a PCP.  Aware that she will need to follow up with a PCP for abnormal blood work today.     ROS: As per HPI.  All other pertinent ROS negative.     Past Medical History:  Diagnosis Date   Chronic neck and back pain    Diverticula, colon    Migraine headache    Polyarthralgia    Vestibular migraine    Past Surgical History:  Procedure Laterality Date   ABDOMINAL HYSTERECTOMY     BACK SURGERY     SHOULDER SURGERY     Allergies  Allergen Reactions   Acetaminophen Nausea And Vomiting   Aspirin Nausea And Vomiting   Acetaminophen-Codeine Nausea Only   Codeine Nausea Only   Penicillins Rash and Nausea And Vomiting    Has patient had a PCN reaction causing immediate rash, facial/tongue/throat swelling, SOB or lightheadedness with hypotension: Yes Has patient had a PCN reaction causing severe rash involving mucus membranes or skin necrosis: No Has patient had a PCN reaction that required hospitalization: No Has patient had a PCN reaction occurring within the last 10 years: No If all of the above answers are "NO", then may proceed with Cephalosporin use.   No current facility-administered  medications on file prior to encounter.   Current Outpatient Medications on File Prior to Encounter  Medication Sig Dispense Refill   albuterol (PROVENTIL HFA;VENTOLIN HFA) 108 (90 Base) MCG/ACT inhaler Inhale 2 puffs into the lungs every 6 (six) hours as needed.      AMITIZA 24 MCG capsule TAKE 1 CAPSULE BY MOUTH ONCE DAILY IN THE MORNING AND 2 CAPSULES IN THE EVENING     amphetamine-dextroamphetamine (ADDERALL) 20 MG tablet Take 20 mg by mouth 2 (two) times daily.     buPROPion (WELLBUTRIN XL) 300 MG 24 hr tablet Take 300 mg by mouth daily. Now taking 200 mg BID  0   diazepam (VALIUM) 10 MG tablet Take 10 mg by mouth 4 (four) times daily as needed for anxiety.     dicyclomine (BENTYL) 10 MG capsule Take 10 mg by mouth 4 (four) times daily as needed.      esomeprazole (NEXIUM) 40 MG capsule Take 40 mg by mouth daily at 12 noon.     fluticasone (FLONASE) 50 MCG/ACT nasal spray Place 2 sprays into the nose daily.      meclizine (ANTIVERT) 25 MG tablet Take by mouth.     montelukast (SINGULAIR) 10 MG tablet Take 10 mg by mouth at bedtime.      ranitidine (ZANTAC) 150 MG tablet Take 150 mg by mouth every morning.     rizatriptan (MAXALT) 10 MG tablet Take 1 tablet earliest onset  of migraine.  May repeat in 2 hours if needed.  Maximum 2 tablets in 24 hours 10 tablet 0   simvastatin (ZOCOR) 20 MG tablet Take 20 mg by mouth at bedtime.     tiZANidine (ZANAFLEX) 2 MG tablet Take 1 tablet (2 mg total) by mouth at bedtime. 30 tablet 0   topiramate (TOPAMAX) 50 MG tablet TAKE 1 & 1/2 (ONE & ONE-HALF) TABLETS BY MOUTH AT BEDTIME 45 tablet 0   zolpidem (AMBIEN) 10 MG tablet Take 10 mg by mouth at bedtime.     Social History   Socioeconomic History   Marital status: Divorced    Spouse name: Not on file   Number of children: 3   Years of education: Not on file   Highest education level: Associate degree: occupational, Scientist, product/process development, or vocational program  Occupational History     Employer: DISABLED  Tobacco Use   Smoking status: Current Every Day Smoker    Packs/day: 0.50    Years: 30.00    Pack years: 15.00    Types: Cigarettes   Smokeless tobacco: Never Used  Building services engineer Use: Every day  Substance and Sexual Activity   Alcohol use: No   Drug use: Never   Sexual activity: Not on file  Other Topics Concern   Not on file  Social History Narrative   Patient is left-handed. She lives alone in a one level house. She drinks 2-3 cups of coffee a day, and rare tea or soda. She works in her yard.   Education - some college   Social Determinants of Corporate investment banker Strain:    Difficulty of Paying Living Expenses: Not on file  Food Insecurity:    Worried About Programme researcher, broadcasting/film/video in the Last Year: Not on file   [employer] of Food in the Last Year: Not on file  Transportation Needs:    Lack of Transportation (Medical): Not on file   Lack of Transportation (Non-Medical): Not on file  Physical Activity:    Days of Exercise per Week: Not on file   Minutes of Exercise per Session: Not on file  Stress:    Feeling of Stress : Not on file  Social Connections:    Frequency of Communication with Friends and Family: Not on file   Frequency of Social Gatherings with Friends and Family: Not on file   Attends Religious Services: Not on file   Active Member of Clubs or Organizations: Not on file   Attends Banker Meetings: Not on file   Marital Status: Not on file  Intimate Partner Violence:    Fear of Current or Ex-Partner: Not on file   Emotionally Abused: Not on file   Physically Abused: Not on file   Sexually Abused: Not on file   Family History  Problem Relation Age of Onset   Stroke Mother    Depression Mother    CAD Mother    CVA Mother    Hypertension Mother    Heart disease Father    Heart attack Father 16   Diabetes Sister 65   Heart attack Brother 48   Other Brother 15       MVA    Breast cancer Neg Hx     OBJECTIVE:  Vitals:   12/09/19 1648 12/09/19 1651  BP: 118/69   Pulse: 90   Resp: 19   Temp: 97.9 F (36.6 C)   TempSrc: Oral   SpO2: 98%  Weight:  149 lb 14.6 oz (68 kg)  Height:  5\' 5"  (1.651 m)    General appearance: alert; appears fatigued, but nontoxic; speaking in full sentences and tolerating own secretions HEENT: NCAT; Ears: EACs clear, TMs pearly gray; Eyes: PERRL.  EOM grossly intact. Sinuses: nontender; Nose: nares patent without rhinorrhea, Throat: oropharynx clear, tonsils non erythematous or enlarged, uvula midline  Neck: supple without LAD Lungs: unlabored respirations, symmetrical air entry; cough: absent; no respiratory distress; CTAB Heart: regular rate and rhythm.   Abdomen: soft, nondistended, normal active bowel sounds; nontender to palpation; no guarding  Back: no CVA tenderness Skin: warm and dry Psychological: alert and cooperative; normal mood and affect  LABS:  Results for orders placed or performed during the hospital encounter of 12/09/19 (from the past 24 hour(s))  POCT urinalysis dipstick     Status: None   Collection Time: 12/09/19  5:35 PM  Result Value Ref Range   Color, UA yellow yellow   Clarity, UA clear clear   Glucose, UA negative negative mg/dL   Bilirubin, UA negative negative   Ketones, POC UA negative negative mg/dL   Spec Grav, UA [id number] - 1.025   Blood, UA negative negative   pH, UA 7.0 5.0 - 8.0   Protein Ur, POC negative negative mg/dL   Urobilinogen, UA 0.2 0.2 or 1.0 E.U./dL   Nitrite, UA Negative Negative   Leukocytes, UA Negative Negative     ASSESSMENT & PLAN:  1. Cough   2. Flu-like symptoms   3. Worried well   4. Dysuria   5. Urinary frequency     Meds ordered this encounter  Medications   benzonatate (TESSALON) 100 MG capsule    Sig: Take 1 capsule (100 mg total) by mouth every 8 (eight) hours.    Dispense:  21 capsule    Refill:  0    Order Specific Question:    Supervising Provider    Answer:   Eustace MooreELSON, YVONNE SUE [[id number]]   cetirizine-pseudoephedrine (ZYRTEC-D) 5-120 MG tablet    Sig: Take 1 tablet by mouth daily.    Dispense:  30 tablet    Refill:  0    Order Specific Question:   Supervising Provider    Answer:   Eustace MooreELSON, YVONNE SUE [[id number]]   COVID testing ordered.  It will take between 5-7 days for test results.  Someone will contact you regarding abnormal results.    In the meantime: You should remain isolated in your home for 10 days from symptom onset AND greater than 72 hours after symptoms resolution (absence of fever without the use of fever-reducing medication and improvement in respiratory symptoms), whichever is longer Get plenty of rest and push fluids Tessalon Perles prescribed for cough Use zyrtec-d for nasal congestion, runny nose, and/or sore throat Use OTC flonase for nasal congestion and runny nose Use medications daily for symptom relief Use OTC medications like ibuprofen or tylenol as needed fever or pain Go to the ED if you have any new or worsening symptoms such as fever, worsening cough, shortness of breath, chest tightness, chest pain, turning blue, changes in mental status, etc...   Urine without infection Urine culture sent.  We will call you with the results.   Push fluids and get plenty of rest.   Follow up with PCP if symptoms persists Go to ER if you have any new or worsening symptoms such as fever, worsening abdominal pain, nausea/vomiting, flank pain   Blood work ordered.   You  will need to follow up with PCP regarding abnormal results  Reviewed expectations re: course of current medical issues. Questions answered. Outlined signs and symptoms indicating need for more acute intervention. Patient verbalized understanding. After Visit Summary given.         Rennis Harding, PA-C 12/09/19 1750

## 2019-12-09 NOTE — ED Triage Notes (Signed)
Chills, fatigue, body aches, headache, cough since this past weekend

## 2019-12-10 LAB — COMPREHENSIVE METABOLIC PANEL
ALT: 10 IU/L (ref 0–32)
AST: 12 IU/L (ref 0–40)
Albumin/Globulin Ratio: 1.6 (ref 1.2–2.2)
Albumin: 3.9 g/dL (ref 3.8–4.9)
Alkaline Phosphatase: 68 IU/L (ref 44–121)
BUN/Creatinine Ratio: 13 (ref 9–23)
BUN: 11 mg/dL (ref 6–24)
Bilirubin Total: 0.2 mg/dL (ref 0.0–1.2)
CO2: 27 mmol/L (ref 20–29)
Calcium: 9.4 mg/dL (ref 8.7–10.2)
Chloride: 107 mmol/L — ABNORMAL HIGH (ref 96–106)
Creatinine, Ser: 0.85 mg/dL (ref 0.57–1.00)
GFR calc Af Amer: 89 mL/min/{1.73_m2} (ref >59)
GFR calc non Af Amer: 77 mL/min/{1.73_m2} (ref >59)
Globulin, Total: 2.5 g/dL (ref 1.5–4.5)
Glucose: 100 mg/dL — ABNORMAL HIGH (ref 65–99)
Potassium: 4.5 mmol/L (ref 3.5–5.2)
Sodium: 144 mmol/L (ref 134–144)
Total Protein: 6.4 g/dL (ref 6.0–8.5)

## 2019-12-11 LAB — URINE CULTURE
Culture: <10000 — AB
Special Requests: NORMAL

## 2019-12-11 LAB — COVID-19, FLU A+B AND RSV
Influenza A, NAA: NOT DETECTED
Influenza B, NAA: NOT DETECTED
RSV, NAA: NOT DETECTED
SARS-CoV-2, NAA: NOT DETECTED

## 2019-12-24 IMAGING — CT CT ABD-PELV W/ CM
2 of 5 series · 15 of 46 positions shown, 17 images · IV contrast (APPLIED)
Comparison: 02/20/2010

CLINICAL DATA: Abdominal and flank pain.

EXAM:
CT ABDOMEN AND PELVIS WITH CONTRAST
TECHNIQUE: Multidetector CT imaging of the abdomen and pelvis was performed
using the standard protocol following bolus administration of
intravenous contrast.
CONTRAST:  100mL AAWH7Y-DMM IOPAMIDOL (AAWH7Y-DMM) INJECTION 61%

[Series 2: axial st · axial · 0.74mm/px · z∈[-546,-141]mm · 12 of 91 slices shown, 14 images]
[im 5/91  soft-tissue]
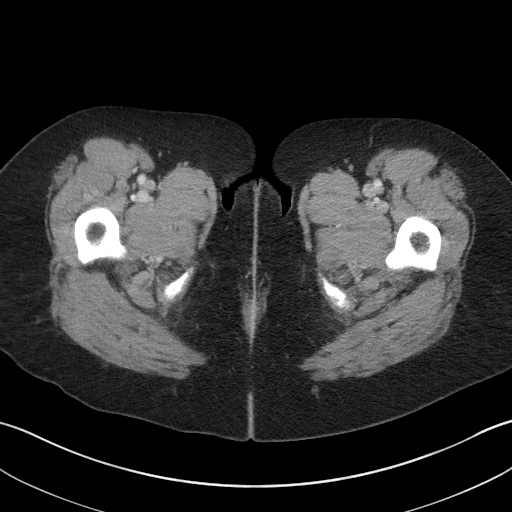
[im 5/91  bone]
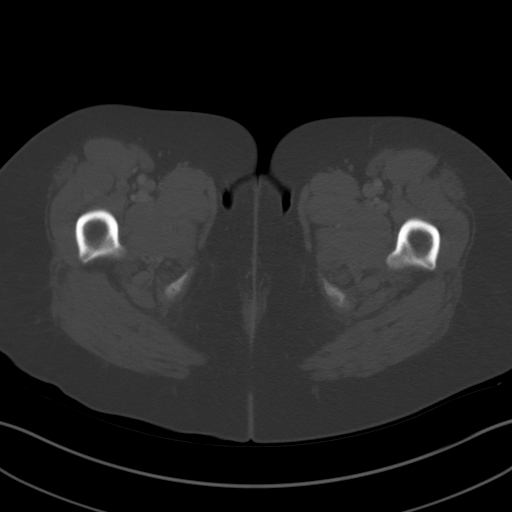
[im 15/91  soft-tissue]
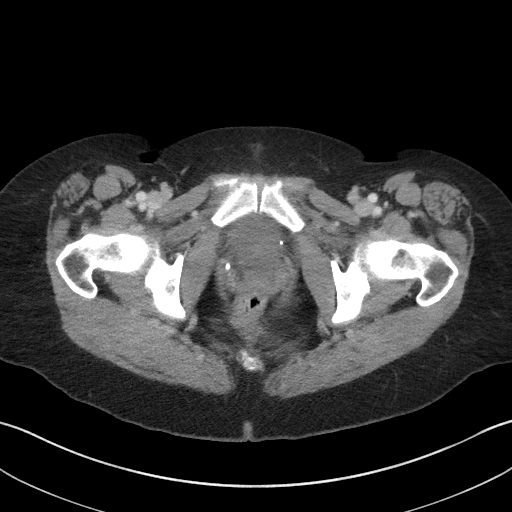
[im 19/91  soft-tissue]
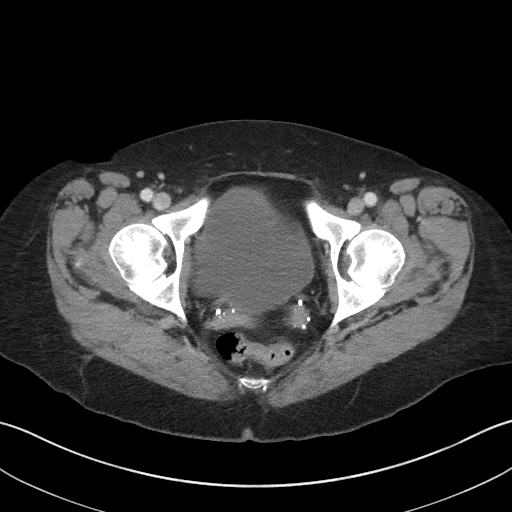
[im 29/91  soft-tissue]
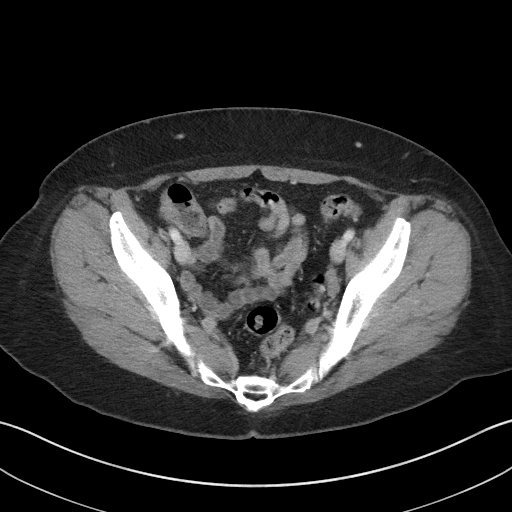
[im 34/91  soft-tissue]
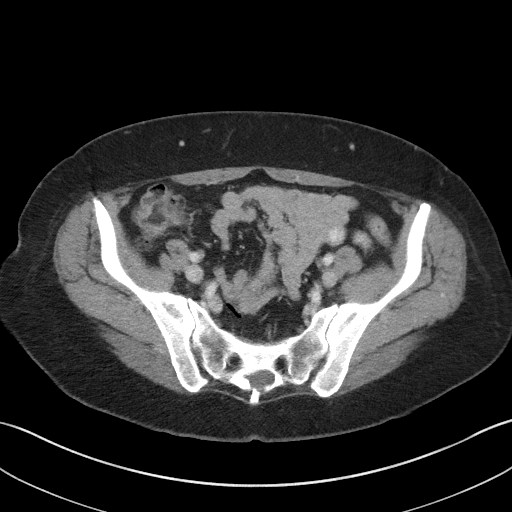
[im 43/91  soft-tissue]
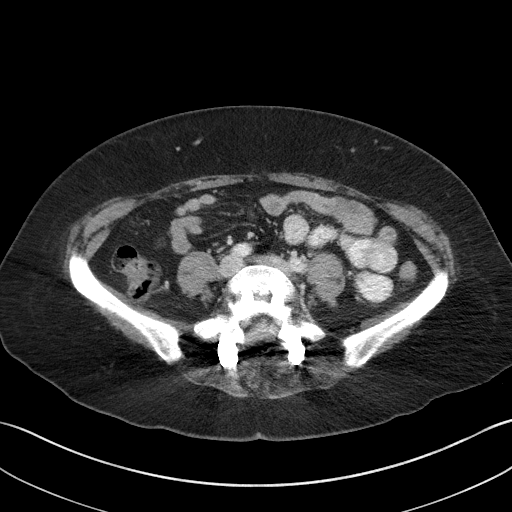
[im 48/91  soft-tissue]
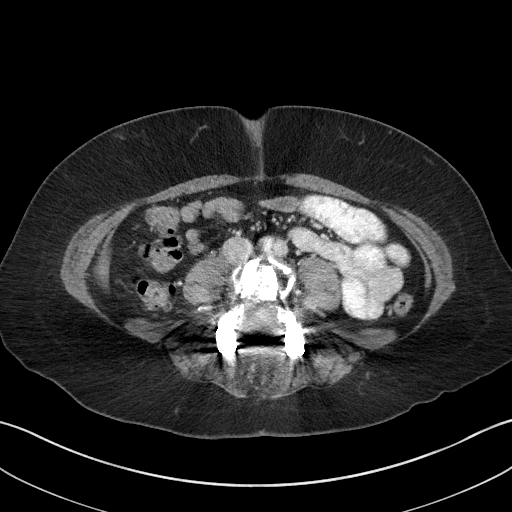
[im 57/91  soft-tissue]
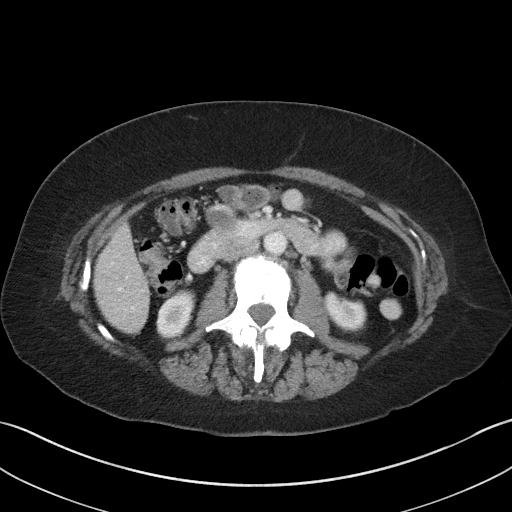
[im 62/91  soft-tissue]
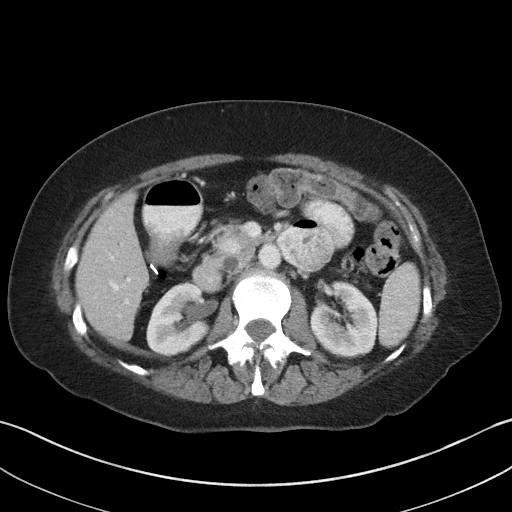
[im 62/91  bone]
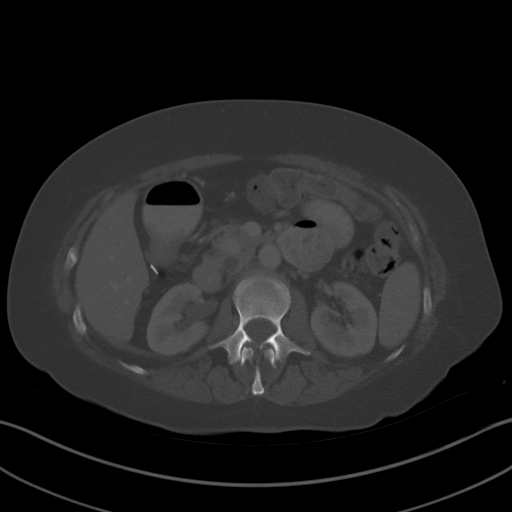
[im 72/91  soft-tissue]
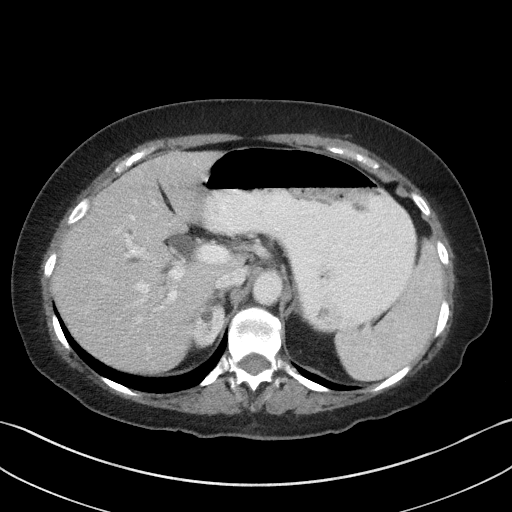
[im 76/91  soft-tissue]
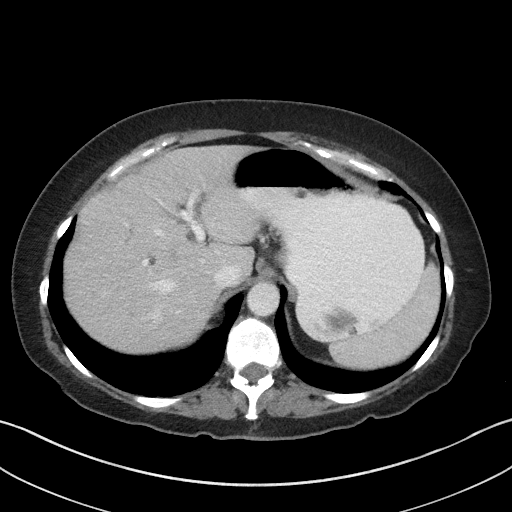
[im 86/91  soft-tissue]
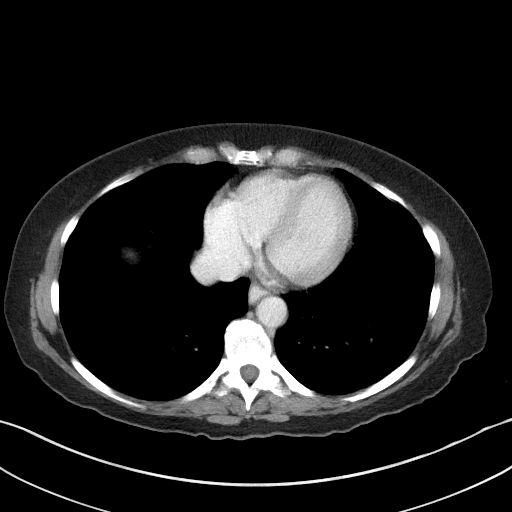

[Series 6: coronal st · coronal · 0.68mm/px · 3 of 79 slices shown]
[im 27/79  soft-tissue]
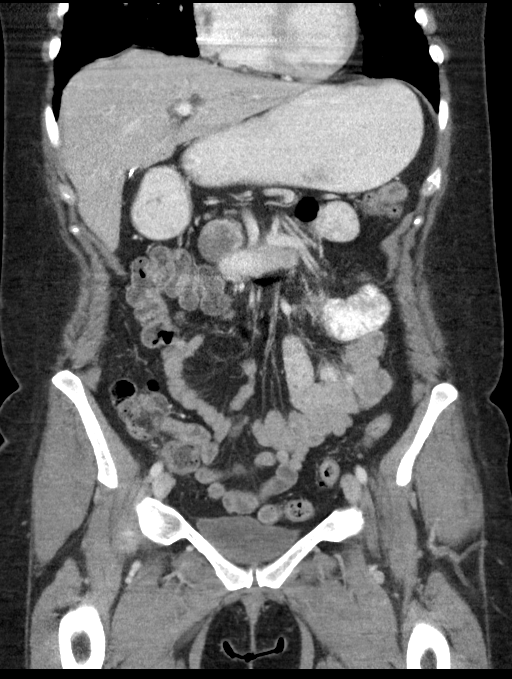
[im 35/79  soft-tissue]
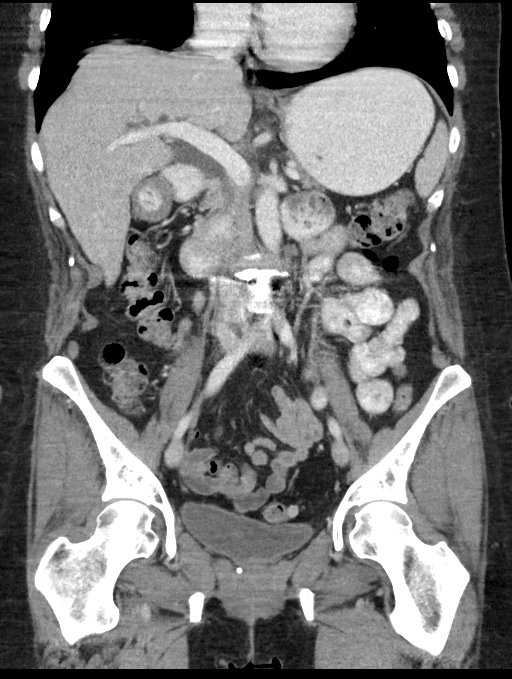
[im 44/79  soft-tissue]
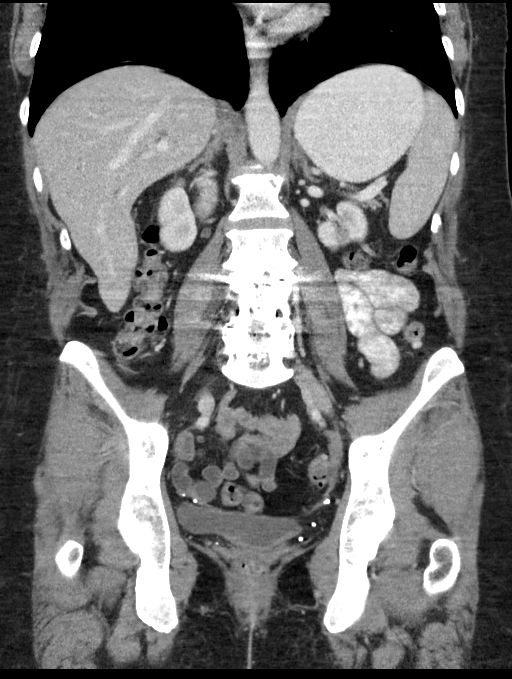

[15 of 46 positions shown; findings below may reference images not displayed]

FINDINGS: Lower chest: Unremarkable.

Hepatobiliary: No focal abnormality within the liver parenchyma.
Gallbladder surgically absent. Intra and extrahepatic biliary duct
dilatation is similar to prior study. Common bile duct measures 10
mm in the head of the pancreas which is stable since 02/20/2010.

Pancreas: No focal mass lesion. No dilatation of the main duct. No
intraparenchymal cyst. No peripancreatic edema.

Spleen: No splenomegaly. No focal mass lesion.

Adrenals/Urinary Tract: No adrenal nodule or mass. Cortical scarring
noted left kidney. 8 mm low-density lesion upper pole right kidney
is similar to prior.

Stomach/Bowel: Stomach is distended with contrast material. Duodenum
is normally positioned as is the ligament of Treitz. Two prominent
duodenal diverticuli are evident. No small bowel wall thickening. No
small bowel dilatation. The terminal ileum is normal. The appendix
is normal. Diverticular changes are noted in the left colon without
evidence of diverticulitis.

Vascular/Lymphatic: No abdominal aortic aneurysm. No abdominal
aortic atherosclerotic calcification. There is no gastrohepatic or
hepatoduodenal ligament lymphadenopathy. No intraperitoneal or
retroperitoneal lymphadenopathy. No pelvic sidewall lymphadenopathy.

Reproductive: Uterus surgically absent.  There is no adnexal mass.

Other: No intraperitoneal free fluid.

Musculoskeletal: No worrisome lytic or sclerotic osseous
abnormality. Patient is status post lower lumbar fusion. Prominent
degenerative changes noted L3-4, the segment just cranial to the
fusion.
IMPRESSION: 1. No acute findings in the abdomen or pelvis. Specifically, no
findings to explain the history of abdominal and flank pain.
2. Stable intra and extrahepatic biliary duct dilatation. Given
stability this likely is related to prior cholecystectomy.
Correlation with liver function test may prove helpful.
3. Left colonic diverticulosis without diverticulitis.

## 2022-01-30 ENCOUNTER — Emergency Department (EMERGENCY_DEPARTMENT_HOSPITAL)
Admission: EM | Admit: 2022-01-30 | Discharge: 2022-01-31 | Disposition: A | Discharge status: Other Acute Inpatient Hospital | Payer: Medicare Other | Source: Home / Self Care | Attending: Emergency Medicine | Admitting: Emergency Medicine

## 2022-01-30 ENCOUNTER — Encounter: Payer: Self-pay | Admitting: Emergency Medicine

## 2022-01-30 ENCOUNTER — Other Ambulatory Visit: Payer: Self-pay

## 2022-01-30 DIAGNOSIS — F17210 Nicotine dependence, cigarettes, uncomplicated: Secondary | ICD-10-CM | POA: Insufficient documentation

## 2022-01-30 DIAGNOSIS — M542 Cervicalgia: Secondary | ICD-10-CM | POA: Insufficient documentation

## 2022-01-30 DIAGNOSIS — M545 Low back pain, unspecified: Secondary | ICD-10-CM | POA: Insufficient documentation

## 2022-01-30 DIAGNOSIS — K589 Irritable bowel syndrome without diarrhea: Secondary | ICD-10-CM | POA: Insufficient documentation

## 2022-01-30 DIAGNOSIS — Z1152 Encounter for screening for COVID-19: Secondary | ICD-10-CM | POA: Insufficient documentation

## 2022-01-30 DIAGNOSIS — I7300 Raynaud's syndrome without gangrene: Secondary | ICD-10-CM | POA: Insufficient documentation

## 2022-01-30 DIAGNOSIS — F331 Major depressive disorder, recurrent, moderate: Secondary | ICD-10-CM | POA: Insufficient documentation

## 2022-01-30 DIAGNOSIS — R5382 Chronic fatigue, unspecified: Secondary | ICD-10-CM | POA: Diagnosis present

## 2022-01-30 DIAGNOSIS — T50904A Poisoning by unspecified drugs, medicaments and biological substances, undetermined, initial encounter: Secondary | ICD-10-CM | POA: Insufficient documentation

## 2022-01-30 DIAGNOSIS — M79609 Pain in unspecified limb: Secondary | ICD-10-CM | POA: Insufficient documentation

## 2022-01-30 DIAGNOSIS — T424X1A Poisoning by benzodiazepines, accidental (unintentional), initial encounter: Secondary | ICD-10-CM | POA: Diagnosis not present

## 2022-01-30 DIAGNOSIS — R4182 Altered mental status, unspecified: Secondary | ICD-10-CM | POA: Diagnosis not present

## 2022-01-30 DIAGNOSIS — F17200 Nicotine dependence, unspecified, uncomplicated: Secondary | ICD-10-CM | POA: Diagnosis present

## 2022-01-30 DIAGNOSIS — Z981 Arthrodesis status: Secondary | ICD-10-CM

## 2022-01-30 LAB — COMPREHENSIVE METABOLIC PANEL
ALT: 14 U/L (ref 0–44)
AST: 19 U/L (ref 15–41)
Albumin: 3.9 g/dL (ref 3.5–5.0)
Alkaline Phosphatase: 48 U/L (ref 38–126)
Anion gap: 7 (ref 5–15)
BUN: 8 mg/dL (ref 6–20)
CO2: 27 mmol/L (ref 22–32)
Calcium: 9.1 mg/dL (ref 8.9–10.3)
Chloride: 109 mmol/L (ref 98–111)
Creatinine, Ser: 0.88 mg/dL (ref 0.44–1.00)
GFR, Estimated: >60 mL/min (ref >60)
Glucose, Bld: 84 mg/dL (ref 70–99)
Potassium: 3.4 mmol/L — ABNORMAL LOW (ref 3.5–5.1)
Sodium: 143 mmol/L (ref 135–145)
Total Bilirubin: 0.5 mg/dL (ref 0.3–1.2)
Total Protein: 6.6 g/dL (ref 6.5–8.1)

## 2022-01-30 LAB — CBC
HCT: 43.1 % (ref 36.0–46.0)
Hemoglobin: 14.3 g/dL (ref 12.0–15.0)
MCH: 31.1 pg (ref 26.0–34.0)
MCHC: 33.2 g/dL (ref 30.0–36.0)
MCV: 93.7 fL (ref 80.0–100.0)
Platelets: 252 10*3/uL (ref 150–400)
RBC: 4.6 MIL/uL (ref 3.87–5.11)
RDW: 12.2 % (ref 11.5–15.5)
WBC: 6.1 10*3/uL (ref 4.0–10.5)
nRBC: 0 % (ref 0.0–0.2)

## 2022-01-30 LAB — ETHANOL: Alcohol, Ethyl (B): <10 mg/dL (ref <10)

## 2022-01-30 LAB — ACETAMINOPHEN LEVEL: Acetaminophen (Tylenol), Serum: <10 ug/mL — ABNORMAL LOW (ref 10–30)

## 2022-01-30 LAB — SALICYLATE LEVEL: Salicylate Lvl: <7 mg/dL — ABNORMAL LOW (ref 7.0–30.0)

## 2022-01-30 MED ORDER — AMPHETAMINE-DEXTROAMPHETAMINE 10 MG PO TABS: 20.0000 mg | ORAL_TABLET | Freq: Two times a day (BID) | ORAL | Status: DC

## 2022-01-30 MED ORDER — TOPIRAMATE 25 MG PO TABS
75.0000 mg | ORAL_TABLET | Freq: Every day | ORAL | Status: DC
  Administered 2022-01-30: 75 mg via ORAL
  Filled 2022-01-30: qty 3

## 2022-01-30 MED ORDER — BUPROPION HCL ER (XL) 150 MG PO TB24: 300.0000 mg | ORAL_TABLET | Freq: Every day | ORAL | Status: DC

## 2022-01-30 MED ORDER — LACTATED RINGERS IV BOLUS
1000.0000 mL | Freq: Once | INTRAVENOUS | Status: AC
  Administered 2022-01-30: 1000 mL via INTRAVENOUS

## 2022-01-30 MED ORDER — FAMOTIDINE 20 MG PO TABS
20.0000 mg | ORAL_TABLET | Freq: Two times a day (BID) | ORAL | Status: DC
  Administered 2022-01-30: 20 mg via ORAL
  Filled 2022-01-30: qty 1

## 2022-01-30 MED ORDER — SIMVASTATIN 10 MG PO TABS
20.0000 mg | ORAL_TABLET | Freq: Every day | ORAL | Status: DC
  Administered 2022-01-30: 20 mg via ORAL
  Filled 2022-01-30: qty 2

## 2022-01-30 MED ORDER — ALUM & MAG HYDROXIDE-SIMETH 200-200-20 MG/5ML PO SUSP: 30.0000 mL | Freq: Four times a day (QID) | ORAL | Status: DC | PRN

## 2022-01-30 MED ORDER — FLUTICASONE PROPIONATE 50 MCG/ACT NA SUSP: 2.0000 | Freq: Every day | NASAL | Status: DC | PRN

## 2022-01-30 MED ORDER — SUMATRIPTAN SUCCINATE 50 MG PO TABS
50.0000 mg | ORAL_TABLET | Freq: Once | ORAL | Status: AC
  Administered 2022-01-30: 50 mg via ORAL
  Filled 2022-01-30: qty 1

## 2022-01-30 MED ORDER — KETOROLAC TROMETHAMINE 30 MG/ML IJ SOLN
15.0000 mg | Freq: Once | INTRAMUSCULAR | Status: AC
  Administered 2022-01-30: 15 mg via INTRAVENOUS
  Filled 2022-01-30: qty 1

## 2022-01-30 MED ORDER — MONTELUKAST SODIUM 10 MG PO TABS
10.0000 mg | ORAL_TABLET | Freq: Every day | ORAL | Status: DC
  Administered 2022-01-30: 10 mg via ORAL
  Filled 2022-01-30: qty 1

## 2022-01-30 MED ORDER — DIAZEPAM 5 MG PO TABS: 10.0000 mg | ORAL_TABLET | Freq: Four times a day (QID) | ORAL | Status: DC | PRN

## 2022-01-30 MED ORDER — LUBIPROSTONE 24 MCG PO CAPS
24.0000 ug | ORAL_CAPSULE | Freq: Two times a day (BID) | ORAL | Status: DC
  Filled 2022-01-30: qty 1

## 2022-01-30 MED ORDER — ALBUTEROL SULFATE HFA 108 (90 BASE) MCG/ACT IN AERS: 2.0000 | INHALATION_SPRAY | Freq: Four times a day (QID) | RESPIRATORY_TRACT | Status: DC | PRN

## 2022-01-30 MED ORDER — DICYCLOMINE HCL 10 MG PO CAPS
10.0000 mg | ORAL_CAPSULE | Freq: Four times a day (QID) | ORAL | Status: DC | PRN
  Filled 2022-01-30: qty 1

## 2022-01-30 NOTE — ED Notes (Signed)
Pt complaining hip pain and anxiety. MD Isaacs notified.

## 2022-01-30 NOTE — ED Notes (Signed)
Patient belongings:  2 brown boots 1 pink jacket 1 pink pants 1 t-shirt

## 2022-01-30 NOTE — BH Assessment (Signed)
Comprehensive Clinical Assessment (CCA) Note  01/30/2022 ADEA GEISEL [id number]  Chief Complaint: Patient is a 57 year old female presenting to [facility] ED initially voluntary but has since been IVC'd. Per triage note Patient to ED via [facility] from home for overdose/SI attempt. Patient took an unknown amount of home medications. Patient altered and confused stating I just want to end it. "I just want to get out of fucking pain." Speech slurred, having to wake up patient multiple to get triaged. During assessment patient appears alert and oriented x4, calm and cooperative. Patient reports "I've been having a lot of hip and back pain, I went to stay with my daughter and I went to [facility] because I stumbled backwards, my knee was numb but they let us go." "I still wasn't feeling well so I went to [facility], I take care of a lot, I have a disabled child, and I've been a little depressed with some crying spells." When asked if patient was trying to end her life, patient denies "I just wanted to get rid of the pain." Patient does report having a current psychiatrist at [facility] and denies any attempts in the past. Patient denies HI/AH/VH.  Per Psyc NP Cristy Friedlander patient is recommended for Inpatient Chief Complaint  Patient presents with   Ingestion   Visit Diagnosis: Major Depressive Disorder, recurrent episode,severe    CCA Screening, Triage and Referral (STR)  Patient Reported Information How did you hear about [place]? Legal System  Referral name: No data recorded Referral phone number: No data recorded  Whom do you see for routine medical problems? No data recorded Practice/Facility Name: No data recorded Practice/Facility Phone Number: No data recorded Name of Contact: No data recorded Contact Number: No data recorded Contact Fax Number: No data recorded Prescriber Name: No data recorded Prescriber Address (if known): No data recorded  What Is the Reason for Your Visit/Call  Today? Patient to ED via [facility] from home for overdose/SI attempt. Patient took an unknown amount of home medications. Patient altered and confused stating I just want to end it. "I just want to get out of fucking pain." Speech slurred, having to wake up patient multiple to get triaged.  How Long Has This Been Causing You Problems? > than 6 months  What Do You Feel Would Help You the Most Today? No data recorded  Have You Recently Been in Any Inpatient Treatment (Hospital/Detox/Crisis Center/28-Day Program)? No data recorded Name/Location of Program/Hospital:No data recorded How Long Were You There? No data recorded When Were You Discharged? No data recorded  Have You Ever Received Services From [facility] Before? No data recorded Who Do You See at [facility]? No data recorded  Have You Recently Had Any Thoughts About Hurting Yourself? No  Are You Planning to Commit Suicide/Harm Yourself At This time? No   Have you Recently Had Thoughts About Hurting Someone Karolee Ohs? No  Explanation: No data recorded  Have You Used Any Alcohol or Drugs in the Past 24 Hours? No  How Long Ago Did You Use Drugs or Alcohol? No data recorded What Did You Use and How Much? No data recorded  Do You Currently Have a Therapist/Psychiatrist? Yes  Name of Therapist/Psychiatrist: [facility]   Have You Been Recently Discharged From Any Office Practice or Programs? No  Explanation of Discharge From Practice/Program: No data recorded    CCA Screening Triage Referral Assessment Type of Contact: Face-to-Face  Is this Initial or Reassessment? No data recorded  Date Telepsych consult ordered in [facility]:  No data recorded Time Telepsych consult ordered in [facility]:  No data recorded  Patient Reported Information Reviewed? No data recorded Patient Left Without Being Seen? No data recorded Reason for Not Completing Assessment: No data recorded  Collateral Involvement: No data recorded  Does  Patient Have a [place]? No data recorded Name and Contact of Legal Guardian: No data recorded If Minor and Not Living with Parent(s), Who has Custody? No data recorded Is CPS involved or ever been involved? Never  Is APS involved or ever been involved? Never   Patient Determined To Be At Risk for Harm To Self or Others Based on Review of Patient Reported Information or Presenting Complaint? Yes, for Self-Harm  Method: No data recorded Availability of Means: No data recorded Intent: No data recorded Notification Required: No data recorded Additional Information for Danger to Others Potential: No data recorded Additional Comments for Danger to Others Potential: No data recorded Are There Guns or Other Weapons in Your Home? No  Types of Guns/Weapons: No data recorded Are These Weapons Safely Secured?                            No data recorded Who Could Verify You Are Able To Have These Secured: No data recorded Do You Have any Outstanding Charges, Pending Court Dates, Parole/Probation? No data recorded Contacted To Inform of Risk of Harm To Self or Others: No data recorded  Location of Assessment: [facility] ED   Does Patient Present under Involuntary Commitment? No  IVC Papers Initial File Date: No data recorded  [place] of Residence: [place]   Patient Currently Receiving the Following Services: Medication Management   Determination of Need: Emergent (2 hours)   Options For Referral: Inpatient Hospitalization     CCA Biopsychosocial Intake/Chief Complaint:  No data recorded Current Symptoms/Problems: No data recorded  Patient Reported Schizophrenia/Schizoaffective Diagnosis in Past: No   Strengths: Patient is able to communicate her needs  Preferences: No data recorded Abilities: No data recorded  Type of Services Patient Feels are Needed: No data recorded  Initial Clinical Notes/Concerns: No data recorded  Mental Health  Symptoms Depression:   Change in energy/activity; Fatigue; Difficulty Concentrating   Duration of Depressive symptoms:  Greater than two weeks   Mania:   None   Anxiety:    None   Psychosis:   None   Duration of Psychotic symptoms: No data recorded  Trauma:   None   Obsessions:   None   Compulsions:   None   Inattention:   None   Hyperactivity/Impulsivity:   None   Oppositional/Defiant Behaviors:   None   Emotional Irregularity:   None   Other Mood/Personality Symptoms:  No data recorded   Mental Status Exam Appearance and self-care  Stature:   Average   Weight:   Average weight   Clothing:   Casual   Grooming:   Normal   Cosmetic use:   None   Posture/gait:   Normal   Motor activity:   Not Remarkable   Sensorium  Attention:   Normal   Concentration:   Normal   Orientation:   X5   Recall/memory:   Normal   Affect and Mood  Affect:   Appropriate   Mood:   Depressed   Relating  Eye contact:   Normal   Facial expression:   Depressed   Attitude toward examiner:  Cooperative   Thought and Language  Speech flow:  Clear and Coherent   Thought content:   Appropriate to Mood and Circumstances   Preoccupation:   None   Hallucinations:   None   Organization:  No data recorded  [employer] of Knowledge:   Fair   Intelligence:   Average   Abstraction:   Functional   Judgement:   Fair   Dance movement psychotherapist:   Adequate   Insight:   Lacking   Decision Making:   Normal   Social Functioning  Social Maturity:   Responsible   Social Judgement:   Normal   Stress  Stressors:   Family conflict; Transitions   Coping Ability:   Contractor Deficits:   None   Supports:   Family     Religion: Religion/Spirituality Are You A Religious Person?: No  Leisure/Recreation: Leisure / Recreation Do You Have Hobbies?: No  Exercise/Diet: Exercise/Diet Do You Exercise?: No Have  You Gained or Lost A Significant Amount of Weight in the Past Six Months?: No Do You Follow a Special Diet?: No Do You Have Any Trouble Sleeping?: Yes Explanation of Sleeping Difficulties: Patient reports some difficulty sleeping due to hip pain   CCA Employment/Education Employment/Work Situation: Employment / Work Situation Employment Situation: On disability Why is Patient on Disability: Physical Health How Long has Patient Been on Disability: Unknown Patient's Job has Been Impacted by Current Illness: No Has Patient ever Been in the [employer]?: No  Education: Education Is Patient Currently Attending School?: No Did You Have An Individualized Education Program (IIEP): No Did You Have Any Difficulty At School?: No Patient's Education Has Been Impacted by Current Illness: No   CCA Family/Childhood History Family and Relationship History: Family history Marital status: Single Does patient have children?: Yes How many children?: 1 How is patient's relationship with their children?: Patient reports taking care of her 23 year old daughter  Childhood History:  Childhood History Did patient suffer any verbal/emotional/physical/sexual abuse as a child?: No Did patient suffer from severe childhood neglect?: No Has patient ever been sexually abused/assaulted/raped as an adolescent or adult?: No Was the patient ever a victim of a crime or a disaster?: No Witnessed domestic violence?: No Has patient been affected by domestic violence as an adult?: No  Child/Adolescent Assessment:     CCA Substance Use Alcohol/Drug Use: Alcohol / Drug Use Pain Medications: See MAR Prescriptions: See MAR Over the Counter: See MAR History of alcohol / drug use?: No history of alcohol / drug abuse                         ASAM's:  Six Dimensions of Multidimensional Assessment  Dimension 1:  Acute Intoxication and/or Withdrawal Potential:      Dimension 2:  Biomedical Conditions and  Complications:      Dimension 3:  Emotional, Behavioral, or Cognitive Conditions and Complications:     Dimension 4:  Readiness to Change:     Dimension 5:  Relapse, Continued use, or Continued Problem Potential:     Dimension 6:  Recovery/Living Environment:     ASAM Severity Score:    ASAM Recommended Level of Treatment:     Substance use Disorder (SUD)    Recommendations for Services/Supports/Treatments:    DSM5 Diagnoses: Patient Active Problem List   Diagnosis Date Noted   Irritable bowel syndrome    Acute pancreatitis 09/26/2017   Screening for osteoporosis 08/12/2017  Raynaud's phenomenon without gangrene 08/12/2017   Chronic fatigue 08/12/2017   Lumbar spondylosis 08/12/2017   Polyarthralgia 08/12/2017   Chronic venous insufficiency 04/12/2016   Tobacco use disorder 01/17/2016   Low back pain 01/17/2016   Hyperlipidemia 01/17/2016   Pain in limb 01/17/2016   Varicose veins of leg with pain, bilateral 01/17/2016   Vestibular migraine 02/07/2015   Thoracic or lumbosacral neuritis or radiculitis 01/22/2012   Arthrodesis status 10/13/2003   Neck pain 10/13/2003   S/P lumbar fusion 10/13/2003    Patient Centered Plan: Patient is on the following Treatment Plan(s):  Depression   Referrals to Alternative Service(s): Referred to Alternative Service(s):   Place:   Date:   Time:    Referred to Alternative Service(s):   Place:   Date:   Time:    Referred to Alternative Service(s):   Place:   Date:   Time:    Referred to Alternative Service(s):   Place:   Date:   Time:      @[facility]COLLABOFCARE @  [employer], LCAS-A

## 2022-01-30 NOTE — ED Provider Notes (Signed)
[facility] Provider Note    Event Date/Time   First MD Initiated Contact with Patient 01/30/22 1640     (approximate)   History   Ingestion   HPI  Yuri A Yeldell is a 57 y.o. female  here with possible intentional overdose. Per EMS report, they were called because pt had taken too much medicine and was minimally responsive. They had to sternal rub her multiple times to arouse her and she was very altered. She reportedly had made statements to family that she did not want to be here anymore and had possibly written a note.   On my interview, pt states she just wants the pain from her hip to stop. She has been seeing a pain specialist and getting injections for this which have not been helping. She states she just wanted "to rest" and is adamant she did not intent to kill herself as she needs to be around for her daughter, who has autism.       Physical Exam   Triage Vital Signs: ED Triage Vitals  Enc Vitals Group     BP 01/30/22 1452 108/77     Pulse Rate 01/30/22 1452 74     Resp 01/30/22 1452 18     Temp 01/30/22 1454 97.6 F (36.4 C)     Temp Source 01/30/22 1454 Oral     SpO2 01/30/22 1452 99 %     Weight --      Height --      Head Circumference --      Peak Flow --      Pain Score --      Pain Loc --      Pain Edu? --      Excl. in [place]? --     Most recent vital signs: Vitals:   01/30/22 1901 01/30/22 2004  BP:  (!) 98/55  Pulse:  74  Resp:  18  Temp: (!) 97.5 F (36.4 C)   SpO2:  98%     General: Awake, no distress.  CV:  Good peripheral perfusion. RRR. Resp:  Normal effort. Lungs CTAB. Abd:  No distention.  Other:  No CN deficits. Slurred speech, falls asleep easily. MAE with 5/5 strength.   ED Results / Procedures / Treatments   Labs (all labs ordered are listed, but only abnormal results are displayed) Labs Reviewed  COMPREHENSIVE METABOLIC PANEL - Abnormal; Notable for the following components:      Result Value    Potassium 3.4 (*)    All other components within normal limits  SALICYLATE LEVEL - Abnormal; Notable for the following components:   Salicylate Lvl <6.6 (*)    All other components within normal limits  ACETAMINOPHEN LEVEL - Abnormal; Notable for the following components:   Acetaminophen (Tylenol), Serum <10 (*)    All other components within normal limits  ETHANOL  CBC  URINE DRUG SCREEN, QUALITATIVE ([facility] ONLY)  URINALYSIS, ROUTINE W REFLEX MICROSCOPIC  POC URINE PREG, ED     EKG Normal sinus rhythm, VR 73. PR 132, QRS 93, QTc 452. No acute ST elevations or depressions. No ischemia or infarct.   RADIOLOGY    I also independently reviewed and agree with radiologist interpretations.   PROCEDURES:  Critical Care performed: No   MEDICATIONS ORDERED IN ED: Medications  alum & mag hydroxide-simeth (MAALOX/MYLANTA) 200-200-20 MG/5ML suspension 30 mL (has no administration in time range)  topiramate (TOPAMAX) tablet 75 mg (has no administration in time range)  simvastatin (ZOCOR) tablet 20 mg (has no administration in time range)  famotidine (PEPCID) tablet 20 mg (has no administration in time range)  montelukast (SINGULAIR) tablet 10 mg (has no administration in time range)  fluticasone (FLONASE) 50 MCG/ACT nasal spray 2 spray (has no administration in time range)  dicyclomine (BENTYL) capsule 10 mg (has no administration in time range)  buPROPion (WELLBUTRIN XL) 24 hr tablet 300 mg (has no administration in time range)  albuterol (VENTOLIN HFA) 108 (90 Base) MCG/ACT inhaler 2 puff (has no administration in time range)  diazepam (VALIUM) tablet 10 mg (has no administration in time range)  lubiprostone (AMITIZA) capsule 24 mcg (has no administration in time range)  amphetamine-dextroamphetamine (ADDERALL) tablet 20 mg (has no administration in time range)  lactated ringers bolus 1,000 mL (0 mLs Intravenous Stopped 01/30/22 1937)  lactated ringers bolus 1,000 mL (0 mLs  Intravenous Stopped 01/30/22 2235)  ketorolac (TORADOL) 30 MG/ML injection 15 mg (15 mg Intravenous Given 01/30/22 2038)  SUMAtriptan (IMITREX) tablet 50 mg (50 mg Oral Given 01/30/22 2038)     IMPRESSION / MDM / ASSESSMENT AND PLAN / ED COURSE  I reviewed the triage vital signs and the nursing notes.                              Differential diagnosis includes, but is not limited to, depression, intentional overdose, polysubstance abuse  Patient's presentation is most consistent with acute presentation with potential threat to life or bodily function.  The patient is on the cardiac monitor to evaluate for evidence of arrhythmia and/or significant heart rate changes.  57 yo F here with reported intentional overdose. Pt is somewhat evasive whether this was intentional or not on my questioning, but history concerning and pt reportedly had written a note. IVC placed. Labs overall reassuring. From an OD perspsective, pt took valium and is now awake, alert. Will monitor for several hours but no other co-ingestants suspected. QTc normal. Labs o/w reassuring.     FINAL CLINICAL IMPRESSION(S) / ED DIAGNOSES   Final diagnoses:  Overdose of undetermined intent, initial encounter     Rx / DC Orders   ED Discharge Orders     None        Note:  This document was prepared using Dragon voice recognition software and may include unintentional dictation errors.   Duffy Bruce, MD 01/30/22 2303

## 2022-01-30 NOTE — ED Notes (Signed)
VOLUNTARY pending TTS/PSYCH consult

## 2022-01-30 NOTE — ED Triage Notes (Signed)
Patient to ED via [facility] from home for overdose/SI attempt. Patient took an unknown amount of home medications. Patient altered and confused stating I just want to end it. "I just want to get out of fucking pain." Speech slurred, having to wake up patient multiple to get triaged.

## 2022-01-30 NOTE — ED Notes (Addendum)
Spoke with pt, Pt states that she would like to speak with a doctor. Pt also states that she is in a lot of pain, and did not try to kill herself.

## 2022-01-30 NOTE — ED Notes (Signed)
Pt ambulated with 1+ assist from stretcher to commode in room.  Pt continues to voice pain in her hip.  This RN made these concerns aware to MD.  Pt reconnected to monitoring system and given warm blankets.  Call light within reach and NAD noted at this time.

## 2022-01-30 NOTE — ED Notes (Signed)
IVC/Rec.Inpt. Admit 

## 2022-01-31 ENCOUNTER — Inpatient Hospital Stay
Admission: AD | Admit: 2022-01-31 | Discharge: 2022-02-01 | DRG: 918 | Disposition: A | Discharge status: Home / Self Care | Payer: Medicare Other | Source: Intra-hospital | Attending: Psychiatry | Admitting: Psychiatry

## 2022-01-31 DIAGNOSIS — M545 Low back pain, unspecified: Secondary | ICD-10-CM | POA: Diagnosis present

## 2022-01-31 DIAGNOSIS — M47816 Spondylosis without myelopathy or radiculopathy, lumbar region: Secondary | ICD-10-CM | POA: Diagnosis present

## 2022-01-31 DIAGNOSIS — Y92009 Unspecified place in unspecified non-institutional (private) residence as the place of occurrence of the external cause: Secondary | ICD-10-CM

## 2022-01-31 DIAGNOSIS — M542 Cervicalgia: Secondary | ICD-10-CM | POA: Diagnosis present

## 2022-01-31 DIAGNOSIS — T424X1A Poisoning by benzodiazepines, accidental (unintentional), initial encounter: Principal | ICD-10-CM | POA: Insufficient documentation

## 2022-01-31 DIAGNOSIS — F331 Major depressive disorder, recurrent, moderate: Secondary | ICD-10-CM | POA: Diagnosis present

## 2022-01-31 DIAGNOSIS — T50904A Poisoning by unspecified drugs, medicaments and biological substances, undetermined, initial encounter: Secondary | ICD-10-CM | POA: Diagnosis not present

## 2022-01-31 DIAGNOSIS — F17210 Nicotine dependence, cigarettes, uncomplicated: Secondary | ICD-10-CM | POA: Diagnosis present

## 2022-01-31 DIAGNOSIS — Z20822 Contact with and (suspected) exposure to covid-19: Secondary | ICD-10-CM | POA: Diagnosis present

## 2022-01-31 DIAGNOSIS — R4182 Altered mental status, unspecified: Secondary | ICD-10-CM | POA: Diagnosis present

## 2022-01-31 DIAGNOSIS — K589 Irritable bowel syndrome without diarrhea: Secondary | ICD-10-CM | POA: Diagnosis present

## 2022-01-31 DIAGNOSIS — Z818 Family history of other mental and behavioral disorders: Secondary | ICD-10-CM

## 2022-01-31 DIAGNOSIS — Z880 Allergy status to penicillin: Secondary | ICD-10-CM

## 2022-01-31 DIAGNOSIS — T424X4D Poisoning by benzodiazepines, undetermined, subsequent encounter: Secondary | ICD-10-CM | POA: Diagnosis not present

## 2022-01-31 DIAGNOSIS — F419 Anxiety disorder, unspecified: Secondary | ICD-10-CM | POA: Diagnosis present

## 2022-01-31 DIAGNOSIS — K219 Gastro-esophageal reflux disease without esophagitis: Secondary | ICD-10-CM | POA: Diagnosis present

## 2022-01-31 HISTORY — DX: Personal history of urinary calculi: Z87.442

## 2022-01-31 LAB — RESP PANEL BY RT-PCR (RSV, FLU A&B, COVID)  RVPGX2
Influenza A by PCR: NEGATIVE
Influenza B by PCR: NEGATIVE
Resp Syncytial Virus by PCR: NEGATIVE
SARS Coronavirus 2 by RT PCR: NEGATIVE

## 2022-01-31 MED ORDER — FAMOTIDINE 20 MG PO TABS
20.0000 mg | ORAL_TABLET | Freq: Two times a day (BID) | ORAL | Status: DC
  Administered 2022-01-31 – 2022-02-01 (×3): 20 mg via ORAL
  Filled 2022-01-31 (×3): qty 1

## 2022-01-31 MED ORDER — AMPHETAMINE-DEXTROAMPHETAMINE 10 MG PO TABS
20.0000 mg | ORAL_TABLET | Freq: Every day | ORAL | Status: DC
  Administered 2022-02-01: 20 mg via ORAL
  Filled 2022-01-31: qty 2

## 2022-01-31 MED ORDER — DICYCLOMINE HCL 10 MG PO CAPS
10.0000 mg | ORAL_CAPSULE | Freq: Four times a day (QID) | ORAL | Status: DC | PRN
  Administered 2022-01-31 – 2022-02-01 (×2): 10 mg via ORAL
  Filled 2022-01-31 (×3): qty 1

## 2022-01-31 MED ORDER — TOPIRAMATE 25 MG PO TABS
75.0000 mg | ORAL_TABLET | Freq: Every day | ORAL | Status: DC
  Administered 2022-01-31: 75 mg via ORAL
  Filled 2022-01-31: qty 3

## 2022-01-31 MED ORDER — SIMVASTATIN 40 MG PO TABS
20.0000 mg | ORAL_TABLET | Freq: Every day | ORAL | Status: DC
  Administered 2022-01-31: 20 mg via ORAL
  Filled 2022-01-31: qty 1

## 2022-01-31 MED ORDER — NICOTINE 14 MG/24HR TD PT24
14.0000 mg | MEDICATED_PATCH | Freq: Every day | TRANSDERMAL | Status: DC
  Administered 2022-01-31 – 2022-02-01 (×2): 14 mg via TRANSDERMAL
  Filled 2022-01-31 (×2): qty 1

## 2022-01-31 MED ORDER — MONTELUKAST SODIUM 10 MG PO TABS
10.0000 mg | ORAL_TABLET | Freq: Every day | ORAL | Status: DC
  Administered 2022-01-31: 10 mg via ORAL
  Filled 2022-01-31: qty 1

## 2022-01-31 MED ORDER — LUBIPROSTONE 24 MCG PO CAPS
24.0000 ug | ORAL_CAPSULE | Freq: Two times a day (BID) | ORAL | Status: DC
  Filled 2022-01-31 (×4): qty 1

## 2022-01-31 MED ORDER — ALBUTEROL SULFATE HFA 108 (90 BASE) MCG/ACT IN AERS: 2.0000 | INHALATION_SPRAY | Freq: Four times a day (QID) | RESPIRATORY_TRACT | Status: DC | PRN

## 2022-01-31 MED ORDER — SUMATRIPTAN SUCCINATE 50 MG PO TABS
50.0000 mg | ORAL_TABLET | Freq: Once | ORAL | Status: AC | PRN
  Administered 2022-01-31: 50 mg via ORAL
  Filled 2022-01-31: qty 1

## 2022-01-31 MED ORDER — BUPROPION HCL ER (XL) 150 MG PO TB24
300.0000 mg | ORAL_TABLET | Freq: Every day | ORAL | Status: DC
  Administered 2022-01-31 – 2022-02-01 (×2): 300 mg via ORAL
  Filled 2022-01-31 (×2): qty 2

## 2022-01-31 MED ORDER — MAGNESIUM HYDROXIDE 400 MG/5ML PO SUSP: 30.0000 mL | Freq: Every day | ORAL | Status: DC | PRN

## 2022-01-31 MED ORDER — FLUTICASONE PROPIONATE 50 MCG/ACT NA SUSP: 2.0000 | Freq: Every day | NASAL | Status: DC | PRN

## 2022-01-31 NOTE — Tx Team (Signed)
Initial Treatment Plan 01/31/2022 6:55 AM [name] [id number]    PATIENT STRESSORS: Health problems   Loss of Mom & Brother   Marital or family conflict     PATIENT STRENGTHS: Electronics engineer  Supportive family/friends    PATIENT IDENTIFIED PROBLEMS: Care for Autistic Daughter  Hip & Back Pain  Depression  Suicidal ideation               DISCHARGE CRITERIA:  Improved stabilization in mood, thinking, and/or behavior Motivation to continue treatment in a less acute level of care Need for constant or close observation no longer present Verbal commitment to aftercare and medication compliance  PRELIMINARY DISCHARGE PLAN: Outpatient therapy Return to previous living arrangement  PATIENT/FAMILY INVOLVEMENT: This treatment plan has been presented to and reviewed with the patient, Olivia Young.  The patient and family have been given the opportunity to ask questions and make suggestions.  Audie Pinto, RN 01/31/2022, 6:55 AM

## 2022-01-31 NOTE — Progress Notes (Signed)
Patient presents pleasant during assessment denying SI/HI/AVH. Pt endorses anxiety and depression. Pt continues to ask about a x-ray from where EMS did a sternal rub on her to wake her at her house. Pt given education. Pt observed interacting appropriately with staff and peers on the unit. Pt compliant with medication administration per MD orders. Pt being monitored Q 15 minutes for safety per unit protocol, remains safe on the unit.

## 2022-01-31 NOTE — [facility] Suicide Risk Assessment (Signed)
[facility] Admission Suicide Risk Assessment   Nursing information obtained from:  Patient Demographic factors:  Divorced or widowed, Caucasian Current Mental Status:  Suicidal ideation indicated by others Loss Factors:  Loss of significant relationship, Decline in physical health Historical Factors:  Family history of mental illness or substance abuse Risk Reduction Factors:  Positive social support  Total Time spent with patient: 45 minutes Principal Problem: Overdose of benzodiazepine Diagnosis:  Principal Problem:   Overdose of benzodiazepine Active Problems:   Low back pain   Neck pain   Irritable bowel syndrome   Lumbar spondylosis   MDD (major depressive disorder), recurrent episode, moderate (HCC)  Subjective Data: Patient seen and chart reviewed.  57 year old woman with chronic medical issues and chronic treatment for depression as well as ADHD and anxiety.  Came to the emergency room yesterday after apparently taking an excessive amount of diazepam.  Patient herself denies that she had any suicidal intent at all.  Her reasons for taking the pills however vary from moment to moment.  She still seems a bit confused about it.  Patient is disorganized and scattered in her thinking although not in a psychotic way more in a manner of someone with severe ADHD.  She has multiple complaints about her chronic pain and multiple complaints about general difficulties managing her life.  She has not shown any dangerous behavior here in the hospital and has been consistent about denying suicidal thoughts.  Reports chronic bad mood but tends to blame most of it on her pain and financial problems  Continued Clinical Symptoms:  Alcohol Use Disorder Identification Test Final Score (AUDIT): 0 The "Alcohol Use Disorders Identification Test", Guidelines for Use in Primary Care, Second Edition.  World Pharmacologist [facility]). Score between 0-7:  no or low risk or alcohol related problems. Score between  8-15:  moderate risk of alcohol related problems. Score between 16-19:  high risk of alcohol related problems. Score 20 or above:  warrants further diagnostic evaluation for alcohol dependence and treatment.   CLINICAL FACTORS:   Severe Anxiety and/or Agitation Depression:   Impulsivity Chronic Pain Medical Diagnoses and Treatments/Surgeries   Musculoskeletal: Strength & Muscle Tone: decreased Gait & Station: unsteady Patient leans: N/A  Psychiatric Specialty Exam:  Presentation  General Appearance:  Bizarre  Eye Contact: Good  Speech: Clear and Coherent  Speech Volume: Normal  Handedness: Right   Mood and Affect  Mood: Irritable; Depressed  Affect: Depressed; Inappropriate   Thought Process  Thought Processes: Coherent  Descriptions of Associations:Loose  Orientation:Full (Time, Place and Person)  Thought Content:Logical  History of Schizophrenia/Schizoaffective disorder:No  Duration of Psychotic Symptoms:No data recorded Hallucinations:Hallucinations: None  Ideas of Reference:None  Suicidal Thoughts:Suicidal Thoughts: No  Homicidal Thoughts:Homicidal Thoughts: No   Sensorium  Memory: Immediate Good; Recent Good; Remote Good  Judgment: Poor  Insight: Poor   Executive Functions  Concentration: Fair  Attention Span: Fair  Recall: [place] of Knowledge: Fair  Language: Good   Psychomotor Activity  Psychomotor Activity: Psychomotor Activity: Decreased   Assets  Assets: Communication Skills; Desire for Improvement; Resilience; Social Support   Sleep  Sleep: Sleep: Good Number of Hours of Sleep: 8    Physical Exam: Physical Exam Vitals and nursing note reviewed.  Constitutional:      Appearance: Normal appearance.  HENT:     Head: Normocephalic and atraumatic.     Mouth/Throat:     Pharynx: Oropharynx is clear.  Eyes:     Pupils: Pupils are equal, round,  and reactive to light.  Cardiovascular:      Rate and Rhythm: Normal rate and regular rhythm.  Pulmonary:     Effort: Pulmonary effort is normal.     Breath sounds: Normal breath sounds.  Abdominal:     General: Abdomen is flat.     Palpations: Abdomen is soft.  Musculoskeletal:        General: Normal range of motion.     Comments: Full examination not done but patient walks with a walker and reports having chronic hip and right-sided pain and weakness.  Skin:    General: Skin is warm and dry.  Neurological:     General: No focal deficit present.     Mental Status: She is alert. Mental status is at baseline.  Psychiatric:        Attention and Perception: Attention normal.        Mood and Affect: Mood is anxious.        Speech: Speech is slurred and tangential.        Behavior: Behavior is slowed.        Thought Content: Thought content normal. Thought content does not include homicidal or suicidal ideation.        Cognition and Memory: Cognition is impaired. Memory is impaired.        Judgment: Judgment is inappropriate.    Review of Systems  Constitutional: Negative.   HENT: Negative.    Eyes: Negative.   Respiratory: Negative.    Cardiovascular: Negative.   Gastrointestinal: Negative.   Musculoskeletal:  Positive for back pain, joint pain, myalgias and neck pain.  Skin: Negative.   Neurological: Negative.   Psychiatric/Behavioral:  Positive for depression and memory loss. Negative for hallucinations, substance abuse and suicidal ideas. The patient is nervous/anxious.    Blood pressure 116/70, pulse 72, temperature 97.8 F (36.6 C), temperature source Oral, resp. rate 18, height 5\' 5"  (1.651 m), weight 69 kg, SpO2 100 %. Body mass index is 25.31 kg/m.   COGNITIVE FEATURES THAT CONTRIBUTE TO RISK:  Thought constriction (tunnel vision)    SUICIDE RISK:   Mild:  Suicidal ideation of limited frequency, intensity, duration, and specificity.  There are no identifiable plans, no associated intent, mild dysphoria and  related symptoms, good self-control (both objective and subjective assessment), few other risk factors, and identifiable protective factors, including available and accessible social support.  PLAN OF CARE: Continue 15-minute checks.  Monitor for behavior.  Got collateral history from her daughter.  Restarting medicine except for the diazepam and decreasing the total amount of Adderall for now.  Ongoing assessment of dangerousness prior to discharge.  I certify that inpatient services furnished can reasonably be expected to improve the patient's condition.   Alethia Berthold, MD 01/31/2022, 1:41 PM

## 2022-01-31 NOTE — Consult Note (Signed)
[facility] Face-to-Face Psychiatry Consult   Reason for Consult:Ingestion  Referring Physician: Dr. Ellender Hose Patient Identification: Olivia Young MRN:  [id number] Principal Diagnosis: <principal problem not specified> Diagnosis:  Active Problems:   Tobacco use disorder   Low back pain   Pain in limb   Neck pain   S/P lumbar fusion   Irritable bowel syndrome   Raynaud's phenomenon without gangrene   Chronic fatigue   MDD (major depressive disorder), recurrent episode, moderate (HCC)   Total Time spent with patient: 1 hour  Subjective: "I took a little more of my Valium." Olivia Young is a 57 y.o. female patient presented to [facility] ED via [facility] from home voluntary and placed under involuntary commitment status (IVC). Per the ED triage nurses note, the patient to the ED via [facility] from home for overdose/SI attempt. Patient took an unknown amount of home medications. Patient altered and confused stating I just want to end it. "I just want to get out of fucking pain." Speech slurred, having to wake up patient multiple to get triaged. The patient was upset once she was told that this writer was recommending psychiatric inpatient admission. The patient states," I am not a  drug addict, and the only reason I took more of my valium is because I did not have my muscle relaxant." The patient shared I assume it is not drugs off the street. I get it from a doctor."    "I've been having a lot of hip and back pain. I went to stay with my daughter, and I went to [facility] because I stumbled backward; my knee was numb, but they let us go." "I still wasn't feeling well, so I went to [place]; I take care of a lot, I have a disabled child, and I've been a little depressed with some crying spells." When asked if the patient was trying to end her life, the patient denied, "I just wanted to get rid of the pain." The patient does report having a current psychiatrist at [facility] and rejects any attempts in the  past.  This provider saw the patient face-to-face, reviewed the chart, and consulted with Dr. Ellender Hose on 01/30/2022 due to the patient's care. The EDP discussed with the patient that the patient meets the criteria for admission to the psychiatric inpatient unit.  Upon evaluation, the patient is alert and oriented x 4, calm, cooperative, and mood-congruent with affect. The patient does not appear to be responding to internal or external stimuli. Neither is the patient presenting with any delusional thinking. The patient denies auditory or visual hallucinations. The patient denies any suicidal, homicidal, or self-harm ideations. The patient is not presenting with any psychotic or paranoid behaviors. During an encounter with the patient, she could answer questions appropriately.  HPI: Dr. Ellender Hose, Emily Filbert Test is a 57 y.o. female  here with possible intentional overdose. Per EMS report, they were called because pt had taken too much medicine and was minimally responsive. They had to sternal rub her multiple times to arouse her and she was very altered. She reportedly had made statements to family that she did not want to be here anymore and had possibly written a note.  On my interview, pt states she just wants the pain from her hip to stop. She has been seeing a pain specialist and getting injections for this which have not been helping. She states she just wanted "to rest" and is adamant she did not intent to kill herself as she  needs to be around for her daughter, who has autism.   Past Psychiatric History: History reviewed. No pertinent past psychiatric history  Risk to Self:   Risk to Others:   Prior Inpatient Therapy:   Prior Outpatient Therapy:    Past Medical History:  Past Medical History:  Diagnosis Date   Chronic neck and back pain    Diverticula, colon    Migraine headache    Polyarthralgia    Vestibular migraine     Past Surgical History:  Procedure Laterality Date   ABDOMINAL  HYSTERECTOMY     BACK SURGERY     SHOULDER SURGERY     Family History:  Family History  Problem Relation Age of Onset   Stroke Mother    Depression Mother    CAD Mother    CVA Mother    Hypertension Mother    Heart disease Father    Heart attack Father 23   Diabetes Sister 83   Heart attack Brother 57   Other Brother 15       MVA   Breast cancer Neg Hx    Family Psychiatric  History: History reviewed. No pertinent family psychiatric history Social History:  Social History   Substance and Sexual Activity  Alcohol Use No     Social History   Substance and Sexual Activity  Drug Use Never    Social History   Socioeconomic History   Marital status: Divorced    Spouse name: Not on file   Number of children: 3   Years of education: Not on file   Highest education level: Associate degree: occupational, Scientist, product/process development, or vocational program  Occupational History    Employer: DISABLED  Tobacco Use   Smoking status: Every Day    Packs/day: 0.50    Years: 30.00    Total pack years: 15.00    Types: Cigarettes   Smokeless tobacco: Never  Vaping Use   Vaping Use: Every day  Substance and Sexual Activity   Alcohol use: No   Drug use: Never   Sexual activity: Not on file  Other Topics Concern   Not on file  Social History Narrative   Patient is left-handed. She lives alone in a one level house. She drinks 2-3 cups of coffee a day, and rare tea or soda. She works in her yard.   Education - some college   Social Determinants of Corporate investment banker Strain: Not on file  Food Insecurity: Not on file  Transportation Needs: Not on file  Physical Activity: Not on file  Stress: Not on file  Social Connections: Not on file   Additional Social History:    Allergies:   Allergies  Allergen Reactions   Acetaminophen Nausea And Vomiting   Aspirin Nausea And Vomiting   Acetaminophen-Codeine Nausea Only   Codeine Nausea Only   Penicillins Rash and Nausea And Vomiting     Has patient had a PCN reaction causing immediate rash, facial/tongue/throat swelling, SOB or lightheadedness with hypotension: Yes Has patient had a PCN reaction causing severe rash involving mucus membranes or skin necrosis: No Has patient had a PCN reaction that required hospitalization: No Has patient had a PCN reaction occurring within the last 10 years: No If all of the above answers are "NO", then may proceed with Cephalosporin use.    Labs:  Results for orders placed or performed during the hospital encounter of 01/30/22 (from the past 48 hour(s))  Comprehensive metabolic panel     Status:  Abnormal   Collection Time: 01/30/22  3:01 PM  Result Value Ref Range   Sodium 143 135 - 145 mmol/L   Potassium 3.4 (L) 3.5 - 5.1 mmol/L   Chloride 109 98 - 111 mmol/L   CO2 27 22 - 32 mmol/L   Glucose, Bld 84 70 - 99 mg/dL    Comment: Glucose reference range applies only to samples taken after fasting for at least 8 hours.   BUN 8 6 - 20 mg/dL   Creatinine, Ser 0.88 0.44 - 1.00 mg/dL   Calcium 9.1 8.9 - 10.3 mg/dL   Total Protein 6.6 6.5 - 8.1 g/dL   Albumin 3.9 3.5 - 5.0 g/dL   AST 19 15 - 41 U/L   ALT 14 0 - 44 U/L   Alkaline Phosphatase 48 38 - 126 U/L   Total Bilirubin 0.5 0.3 - 1.2 mg/dL   GFR, Estimated >60 >60 mL/min    Comment: (NOTE) Calculated using the CKD-EPI Creatinine Equation (2021)    Anion gap 7 5 - 15    Comment: Performed at [facility], [address] [place], [place] [address]  Ethanol     Status: None   Collection Time: 01/30/22  3:01 PM  Result Value Ref Range   Alcohol, Ethyl (B) <10 <10 mg/dL    Comment: (NOTE) Lowest detectable limit for serum alcohol is 10 mg/dL.  For medical purposes only. Performed at [facility], [address] [place], [place] [address]   Salicylate level     Status: Abnormal   Collection Time: 01/30/22  3:01 PM  Result Value Ref Range   Salicylate Lvl <0.9 (L) 7.0 - 30.0 mg/dL    Comment: Performed  at [facility], [address] [place], [place] [address]  Acetaminophen level     Status: Abnormal   Collection Time: 01/30/22  3:01 PM  Result Value Ref Range   Acetaminophen (Tylenol), Serum <10 (L) 10 - 30 ug/mL    Comment: (NOTE) Therapeutic concentrations vary significantly. A range of 10-30 ug/mL  may be an effective concentration for many patients. However, some  are best treated at concentrations outside of this range. Acetaminophen concentrations >150 ug/mL at 4 hours after ingestion  and >50 ug/mL at 12 hours after ingestion are often associated with  toxic reactions.  Performed at [facility], [address] [place], [place] [address]   cbc     Status: None   Collection Time: 01/30/22  3:01 PM  Result Value Ref Range   WBC 6.1 4.0 - 10.5 K/uL   RBC 4.60 3.87 - 5.11 MIL/uL   Hemoglobin 14.3 12.0 - 15.0 g/dL   HCT 43.1 36.0 - 46.0 %   MCV 93.7 80.0 - 100.0 fL   MCH 31.1 26.0 - 34.0 pg   MCHC 33.2 30.0 - 36.0 g/dL   RDW 12.2 11.5 - 15.5 %   Platelets 252 150 - 400 K/uL   nRBC 0.0 0.0 - 0.2 %    Comment: Performed at [facility], [address], [place], [place] [address]    Current Facility-Administered Medications  Medication Dose Route Frequency Provider Last Rate Last Admin   albuterol (VENTOLIN HFA) 108 (90 Base) MCG/ACT inhaler 2 puff  2 puff Inhalation Q6H PRN Duffy Bruce, MD       alum & mag hydroxide-simeth (MAALOX/MYLANTA) 200-200-20 MG/5ML suspension 30 mL  30 mL Oral Q6H PRN Duffy Bruce, MD       amphetamine-dextroamphetamine (ADDERALL) tablet  20 mg  20 mg Oral BID Otelia Sergeant, RPH       buPROPion (WELLBUTRIN XL) 24 hr tablet 300 mg  300 mg Oral Daily Shaune Pollack, MD       diazepam (VALIUM) tablet 10 mg  10 mg Oral Q6H PRN Shaune Pollack, MD       dicyclomine (BENTYL) capsule 10 mg  10 mg Oral QID PRN Shaune Pollack, MD       famotidine (PEPCID) tablet 20 mg  20 mg Oral BID Shaune Pollack, MD   20 mg at  01/30/22 2340   fluticasone (FLONASE) 50 MCG/ACT nasal spray 2 spray  2 spray Each Nare Daily PRN Shaune Pollack, MD       lubiprostone (AMITIZA) capsule 24 mcg  24 mcg Oral BID WC Shaune Pollack, MD       montelukast (SINGULAIR) tablet 10 mg  10 mg Oral QHS Shaune Pollack, MD   10 mg at 01/30/22 2340   simvastatin (ZOCOR) tablet 20 mg  20 mg Oral QHS Shaune Pollack, MD   20 mg at 01/30/22 2340   topiramate (TOPAMAX) tablet 75 mg  75 mg Oral QHS Shaune Pollack, MD   75 mg at 01/30/22 2340   Current Outpatient Medications  Medication Sig Dispense Refill   albuterol (PROVENTIL HFA;VENTOLIN HFA) 108 (90 Base) MCG/ACT inhaler Inhale 2 puffs into the lungs every 6 (six) hours as needed.      AMITIZA 24 MCG capsule TAKE 1 CAPSULE BY MOUTH ONCE DAILY IN THE MORNING AND 2 CAPSULES IN THE EVENING     amphetamine-dextroamphetamine (ADDERALL) 20 MG tablet Take 20 mg by mouth 2 (two) times daily.     benzonatate (TESSALON) 100 MG capsule Take 1 capsule (100 mg total) by mouth every 8 (eight) hours. 21 capsule 0   buPROPion (WELLBUTRIN XL) 300 MG 24 hr tablet Take 300 mg by mouth daily. Now taking 200 mg BID  0   cetirizine-pseudoephedrine (ZYRTEC-D) 5-120 MG tablet Take 1 tablet by mouth daily. 30 tablet 0   diazepam (VALIUM) 10 MG tablet Take 10 mg by mouth 4 (four) times daily as needed for anxiety.     dicyclomine (BENTYL) 10 MG capsule Take 10 mg by mouth 4 (four) times daily as needed.      esomeprazole (NEXIUM) 40 MG capsule Take 40 mg by mouth daily at 12 noon.     fluticasone (FLONASE) 50 MCG/ACT nasal spray Place 2 sprays into the nose daily.      meclizine (ANTIVERT) 25 MG tablet Take by mouth.     montelukast (SINGULAIR) 10 MG tablet Take 10 mg by mouth at bedtime.      ranitidine (ZANTAC) 150 MG tablet Take 150 mg by mouth every morning.     rizatriptan (MAXALT) 10 MG tablet Take 1 tablet earliest onset of migraine.  May repeat in 2 hours if needed.  Maximum 2 tablets in 24 hours 10 tablet  0   simvastatin (ZOCOR) 20 MG tablet Take 20 mg by mouth at bedtime.     tiZANidine (ZANAFLEX) 2 MG tablet Take 1 tablet (2 mg total) by mouth at bedtime. 30 tablet 0   topiramate (TOPAMAX) 50 MG tablet TAKE 1 & 1/2 (ONE & ONE-HALF) TABLETS BY MOUTH AT BEDTIME 45 tablet 0   zolpidem (AMBIEN) 10 MG tablet Take 10 mg by mouth at bedtime.      Musculoskeletal: Strength & Muscle Tone: within normal limits Gait & Station: normal Patient leans: N/A  Psychiatric  Specialty Exam:  Presentation  General Appearance:  Bizarre  Eye Contact: Good  Speech: Clear and Coherent  Speech Volume: Normal  Handedness: Right   Mood and Affect  Mood: Irritable; Depressed  Affect: Depressed; Inappropriate   Thought Process  Thought Processes: Coherent  Descriptions of Associations:Loose  Orientation:Full (Time, Place and Person)  Thought Content:Logical  History of Schizophrenia/Schizoaffective disorder:No  Duration of Psychotic Symptoms:No data recorded Hallucinations:Hallucinations: None  Ideas of Reference:None  Suicidal Thoughts:Suicidal Thoughts: No  Homicidal Thoughts:Homicidal Thoughts: No   Sensorium  Memory: Immediate Good; Recent Good; Remote Good  Judgment: Poor  Insight: Poor   Executive Functions  Concentration: Fair  Attention Span: Fair  Recall: Fair  Fund of Knowledge: Fair  Language: Good   Psychomotor Activity  Psychomotor Activity: Psychomotor Activity: Decreased   Assets  Assets: Communication Skills; Desire for Improvement; Resilience; Social Support   Sleep  Sleep: Sleep: Good Number of Hours of Sleep: 8   Physical Exam: Physical Exam Vitals and nursing note reviewed.  Constitutional:      Appearance: Normal appearance. She is normal weight. She is ill-appearing.  HENT:     Head: Normocephalic and atraumatic.     Right Ear: External ear normal.     Left Ear: External ear normal.     Nose: Nose normal.      Mouth/Throat:     Mouth: Mucous membranes are moist.  Cardiovascular:     Rate and Rhythm: Normal rate.     Pulses: Normal pulses.  Pulmonary:     Effort: Pulmonary effort is normal.  Musculoskeletal:        General: Normal range of motion.     Cervical back: Normal range of motion.  Neurological:     General: No focal deficit present.     Mental Status: She is alert and oriented to person, place, and time.  Psychiatric:        Attention and Perception: Attention and perception normal.        Mood and Affect: Mood is depressed. Affect is blunt and inappropriate.        Speech: Speech normal.        Behavior: Behavior is agitated.        Thought Content: Thought content normal.        Cognition and Memory: Cognition and memory normal.        Judgment: Judgment normal.    ROS Blood pressure 90/61, pulse 74, temperature (!) 97.5 F (36.4 C), temperature source Oral, resp. rate 16, SpO2 98 %. There is no height or weight on file to calculate BMI.  Treatment Plan Summary: Plan   Patient does meet the criteria for psychiatric inpatient admission  Disposition: Recommend psychiatric Inpatient admission when medically cleared. Supportive therapy provided about ongoing stressors.  Gillermo Murdoch, NP 01/31/2022 2:20 AM

## 2022-01-31 NOTE — BH Assessment (Signed)
Patient is to be admitted to [facility] by Psychiatric Nurse Practitioner Caroline Sauger.  Attending Physician will be Dr.  Weber Cooks .   Patient has been assigned to room 325, by [facility] Charge Nurse Kara Dies.    ER staff is aware of the admission: [facility] ER Secretary   Dr. Leonides Schanz, ER MD  Gerald Stabs Patient's Nurse  [facility] Patient Access.

## 2022-01-31 NOTE — Group Note (Signed)
[facility] LCSW Group Therapy Note   Group Date: 01/31/2022 Start Time: 1300 End Time: 1400   Type of Therapy/Topic:  Group Therapy:  Emotion Regulation  Participation Level:  Did Not Attend   Mood:  Description of Group:    The purpose of this group is to assist patients in learning to regulate negative emotions and experience positive emotions. Patients will be guided to discuss ways in which they have been vulnerable to their negative emotions. These vulnerabilities will be juxtaposed with experiences of positive emotions or situations, and patients challenged to use positive emotions to combat negative ones. Special emphasis will be placed on coping with negative emotions in conflict situations, and patients will process healthy conflict resolution skills.  Therapeutic Goals: Patient will identify two positive emotions or experiences to reflect on in order to balance out negative emotions:  Patient will label two or more emotions that they find the most difficult to experience:  Patient will be able to demonstrate positive conflict resolution skills through discussion or role plays:   Summary of Patient Progress: Patient did not attend group despite encouraged participation.   Therapeutic Modalities:   Cognitive Behavioral Therapy Feelings Identification Dialectical Behavioral Therapy   Shantay Sonn W Emmi Wertheim, LCSWA 
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
## 2022-01-31 NOTE — Progress Notes (Signed)
Recreation Therapy Notes  Date: 01/31/2022   Time: 10:30 am   Location: Courtyard      Behavioral response: N/A   Intervention Topic: Values    Discussion/Intervention: Patient refused to attend group.    Clinical Observations/Feedback:  Patient refused to attend group.    Shaniya Tashiro LRT/CTRS          Randle Shatzer 01/31/2022 11:07 AM

## 2022-01-31 NOTE — H&P (Signed)
Psychiatric Admission Assessment Adult  Patient Identification: Olivia Young MRN:  [id number] Date of Evaluation:  01/31/2022 Chief Complaint:  MDD (major depressive disorder), recurrent episode, moderate (HCC) [F33.1] Principal Diagnosis: Overdose of benzodiazepine Diagnosis:  Principal Problem:   Overdose of benzodiazepine Active Problems:   Low back pain   Neck pain   Irritable bowel syndrome   Lumbar spondylosis   MDD (major depressive disorder), recurrent episode, moderate (HCC)  History of Present Illness: Patient seen and chart reviewed.  57 year old woman with a history of treatment for depression and multiple medical problems came to the hospital yesterday with altered mental status.  History obtained from the patient's chart and also speaking with the patient's daughter.  Patient's daughter reports that yesterday late morning she was at work and received a call from her sister who has mental health needs herself but who was at home with the mother.  It was reported to her that our patient had requested that 911 be called.  The daughter I spoke with said that she is used to the patient being overdramatic in the past and so asked that the next-door neighbor could come and check on our patient.  When the next-door neighbor did apparently they thought she was too sedated and called 911.  In the emergency room patient was found to be sedated requiring a lot of stimulation just to respond.  Ultimately seems like she did wake up.  Did not require intubation or medical hospitalization.  Gave a history of having taken an extra amount of her diazepam.  Patient seems to have mostly denied in the emergency room that she had any suicidal intent.  She seems to have mostly claimed that she took it because of her pain.  In my interview with her today the patient reports again that she took perhaps 2 extra tablets of diazepam beyond her normal dosage.  At 1 point she said she did it because of pain at  another point she could not give a clear answer.  The daughter I spoke with says that when she did get home she found a letter written by the patient which did not specifically say she was going to kill her self but contained language that certainly seemed to imply it.  Patient is a very scattered and somewhat difficult historian.  Very tangential.  She complains of chronic pain and complains that her treatment for it is an adequate.  She complains that she has excessive stress on her because she has to take care of her special needs daughter by herself.  At times seems to have some rambling almost paranoid thoughts about her other daughters but denied hallucinations and did not see anything obviously delusional.  Patient consistently denies suicidal ideation speaking with me.  She is normally prescribed diazepam 10 mg 4 times a day as well as Adderall and Ambien.  No drug screen was done in the emergency room.  Patient denies that she had used any narcotics.  The daughter I spoke with also notes that at times she has thought that her mother was paranoid believing people were out to get her but had never had any kind of psychotic diagnosis in the past. Associated Signs/Symptoms: Depression Symptoms:  depressed mood, anhedonia, fatigue, feelings of worthlessness/guilt, difficulty concentrating, anxiety, (Hypo) Manic Symptoms:  Impulsivity, Anxiety Symptoms:  Excessive Worry, Psychotic Symptoms:   I do not think she has clearly psychotic levels of paranoia but she does seem distrustful of her daughters. PTSD Symptoms: Cannot rule  out PTSD.  Longstanding complicated history. Total Time spent with patient: 45 minutes  Past Psychiatric History: As far as I can tell no previous psychiatric hospitalizations.  Patient denies previous suicide attempts.  She sees a psychiatrist in [place] and is currently prescribed Wellbutrin for depression, Valium Ambien and Adderall.  Patient says she has tried SSRIs in  the past but they made her feel sick to her stomach.  Patient denies alcohol abuse denies any history of substance abuse.  Is the patient at risk to self? No.  Has the patient been a risk to self in the past 6 months? Yes.    Has the patient been a risk to self within the distant past? No.  Is the patient a risk to others? No.  Has the patient been a risk to others in the past 6 months? No.  Has the patient been a risk to others within the distant past? No.   [place] Scale:  Flowsheet Row Admission (Current) from 01/31/2022 in [facility] ED from 01/30/2022 in [facility] Department at [facility]  C-SSRS RISK CATEGORY High Risk High Risk        Prior Inpatient Therapy: No. If yes, describe none Prior Outpatient Therapy: Yes.   If yes, describe sees an outpatient psychiatrist  Alcohol Screening: Patient refused Alcohol Screening Tool: Yes 1. How often do you have a drink containing alcohol?: Never 2. How many drinks containing alcohol do you have on a typical day when you are drinking?: 1 or 2 3. How often do you have six or more drinks on one occasion?: Never AUDIT-C Score: 0 4. How often during the last year have you found that you were not able to stop drinking once you had started?: Never 5. How often during the last year have you failed to do what was normally expected from you because of drinking?: Never 6. How often during the last year have you needed a first drink in the morning to get yourself going after a heavy drinking session?: Never 7. How often during the last year have you had a feeling of guilt of remorse after drinking?: Never 8. How often during the last year have you been unable to remember what happened the night before because you had been drinking?: Never 9. Have you or someone else been injured as a result of your drinking?: No 10. Has a relative or friend or a doctor or another health worker been concerned about your  drinking or suggested you cut down?: No Alcohol Use Disorder Identification Test Final Score (AUDIT): 0 Alcohol Brief Interventions/Follow-up: Patient Refused Substance Abuse History in the last 12 months:  Yes.   Consequences of Substance Abuse: I am going to check yes just on the basis of coming to the hospital from an excessive use of Valium.  Patient insists that she does not misuse her medicines.  This may be true generally but it does sound like she may have some ill effects from there are doses anyway.  Patient denies any intentional drug use. Previous Psychotropic Medications: Yes  Psychological Evaluations: Yes  Past Medical History:  Past Medical History:  Diagnosis Date   Chronic neck and back pain    Diverticula, colon    History of kidney stones    Migraine headache    Polyarthralgia    Vestibular migraine     Past Surgical History:  Procedure Laterality Date   ABDOMINAL HYSTERECTOMY     BACK SURGERY  SHOULDER SURGERY     Family History:  Family History  Problem Relation Age of Onset   Stroke Mother    Depression Mother    CAD Mother    CVA Mother    Hypertension Mother    Heart disease Father    Heart attack Father 71   Diabetes Sister 25   Heart attack Brother 21   Other Brother 38       MVA   Breast cancer Neg Hx    Family Psychiatric  History: Patient reports her daughters have depression Tobacco Screening:  Social History   Tobacco Use  Smoking Status Every Day   Packs/day: 0.50   Years: 30.00   Total pack years: 15.00   Types: Cigarettes  Smokeless Tobacco Never    BH Tobacco Counseling     Are you interested in Tobacco Cessation Medications?  Yes, implement Nicotene Replacement Protocol Counseled patient on smoking cessation:  Yes Reason Tobacco Screening Not Completed: Patient Refused Screening       Social History:  Social History   Substance and Sexual Activity  Alcohol Use No     Social History   Substance and Sexual  Activity  Drug Use Never    Additional Social History:                           Allergies:   Allergies  Allergen Reactions   Acetaminophen Nausea And Vomiting   Aspirin Nausea And Vomiting   Acetaminophen-Codeine Nausea Only   Codeine Nausea Only   Penicillins Rash and Nausea And Vomiting    Has patient had a PCN reaction causing immediate rash, facial/tongue/throat swelling, SOB or lightheadedness with hypotension: Yes Has patient had a PCN reaction causing severe rash involving mucus membranes or skin necrosis: No Has patient had a PCN reaction that required hospitalization: No Has patient had a PCN reaction occurring within the last 10 years: No If all of the above answers are "NO", then may proceed with Cephalosporin use.   Lab Results:  Results for orders placed or performed during the hospital encounter of 01/30/22 (from the past 48 hour(s))  Comprehensive metabolic panel     Status: Abnormal   Collection Time: 01/30/22  3:01 PM  Result Value Ref Range   Sodium 143 135 - 145 mmol/L   Potassium 3.4 (L) 3.5 - 5.1 mmol/L   Chloride 109 98 - 111 mmol/L   CO2 27 22 - 32 mmol/L   Glucose, Bld 84 70 - 99 mg/dL    Comment: Glucose reference range applies only to samples taken after fasting for at least 8 hours.   BUN 8 6 - 20 mg/dL   Creatinine, Ser 0.88 0.44 - 1.00 mg/dL   Calcium 9.1 8.9 - 10.3 mg/dL   Total Protein 6.6 6.5 - 8.1 g/dL   Albumin 3.9 3.5 - 5.0 g/dL   AST 19 15 - 41 U/L   ALT 14 0 - 44 U/L   Alkaline Phosphatase 48 38 - 126 U/L   Total Bilirubin 0.5 0.3 - 1.2 mg/dL   GFR, Estimated >60 >60 mL/min    Comment: (NOTE) Calculated using the CKD-EPI Creatinine Equation (2021)    Anion gap 7 5 - 15    Comment: Performed at [facility], [address], [place], [place] [address]  Ethanol     Status: None   Collection Time: 01/30/22  3:01 PM  Result Value Ref Range   Alcohol,  Ethyl (B) <10 <10 mg/dL    Comment: (NOTE) Lowest detectable  limit for serum alcohol is 10 mg/dL.  For medical purposes only. Performed at [facility], [address] [place], [place] [address]   Salicylate level     Status: Abnormal   Collection Time: 01/30/22  3:01 PM  Result Value Ref Range   Salicylate Lvl <7.0 (L) 7.0 - 30.0 mg/dL    Comment: Performed at [facility], [address] [place], [place] [address]  Acetaminophen level     Status: Abnormal   Collection Time: 01/30/22  3:01 PM  Result Value Ref Range   Acetaminophen (Tylenol), Serum <10 (L) 10 - 30 ug/mL    Comment: (NOTE) Therapeutic concentrations vary significantly. A range of 10-30 ug/mL  may be an effective concentration for many patients. However, some  are best treated at concentrations outside of this range. Acetaminophen concentrations >150 ug/mL at 4 hours after ingestion  and >50 ug/mL at 12 hours after ingestion are often associated with  toxic reactions.  Performed at [facility], [address] [place], [place] [address]   cbc     Status: None   Collection Time: 01/30/22  3:01 PM  Result Value Ref Range   WBC 6.1 4.0 - 10.5 K/uL   RBC 4.60 3.87 - 5.11 MIL/uL   Hemoglobin 14.3 12.0 - 15.0 g/dL   HCT 75.6 43.3 - 29.5 %   MCV 93.7 80.0 - 100.0 fL   MCH 31.1 26.0 - 34.0 pg   MCHC 33.2 30.0 - 36.0 g/dL   RDW 18.8 41.6 - 60.6 %   Platelets 252 150 - 400 K/uL   nRBC 0.0 0.0 - 0.2 %    Comment: Performed at [facility], [address], [place], [place] [address]  Resp panel by RT-PCR (RSV, Flu A&B, Covid) Anterior Nasal Swab     Status: None   Collection Time: 01/31/22  3:20 AM   Specimen: Anterior Nasal Swab  Result Value Ref Range   SARS Coronavirus 2 by RT PCR NEGATIVE NEGATIVE    Comment: (NOTE) SARS-CoV-2 target nucleic acids are NOT DETECTED.  The SARS-CoV-2 RNA is generally detectable in upper respiratory specimens during the acute phase of infection. The lowest concentration of SARS-CoV-2 viral copies  this assay can detect is 138 copies/mL. A negative result does not preclude SARS-Cov-2 infection and should not be used as the sole basis for treatment or other patient management decisions. A negative result may occur with  improper specimen collection/handling, submission of specimen other than nasopharyngeal swab, presence of viral mutation(s) within the areas targeted by this assay, and inadequate number of viral copies(<138 copies/mL). A negative result must be combined with clinical observations, patient history, and epidemiological information. The expected result is Negative.  Fact Sheet for Patients:  BloggerCourse.com  Fact Sheet for Healthcare Providers:  SeriousBroker.it  This test is no t yet approved or cleared by the [place] FDA and  has been authorized for detection and/or diagnosis of SARS-CoV-2 by FDA under an Emergency Use Authorization (EUA). This EUA will remain  in effect (meaning this test can be used) for the duration of the COVID-19 declaration under Section 564(b)(1) of the Act, 21 U.S.C.section 360bbb-3(b)(1), unless the authorization is terminated  or revoked sooner.       Influenza A by PCR NEGATIVE NEGATIVE   Influenza B by PCR NEGATIVE NEGATIVE    Comment: (NOTE) The Xpert Xpress SARS-CoV-2/FLU/RSV plus assay is intended as  an aid in the diagnosis of influenza from Nasopharyngeal swab specimens and should not be used as a sole basis for treatment. Nasal washings and aspirates are unacceptable for Xpert Xpress SARS-CoV-2/FLU/RSV testing.  Fact Sheet for Patients: EntrepreneurPulse.com.au  Fact Sheet for Healthcare Providers: IncredibleEmployment.be  This test is not yet approved or cleared by the [place] FDA and has been authorized for detection and/or diagnosis of SARS-CoV-2 by FDA under an Emergency Use Authorization (EUA). This EUA will  remain in effect (meaning this test can be used) for the duration of the COVID-19 declaration under Section 564(b)(1) of the Act, 21 U.S.C. section 360bbb-3(b)(1), unless the authorization is terminated or revoked.     Resp Syncytial Virus by PCR NEGATIVE NEGATIVE    Comment: (NOTE) Fact Sheet for Patients: EntrepreneurPulse.com.au  Fact Sheet for Healthcare Providers: IncredibleEmployment.be  This test is not yet approved or cleared by the [place] FDA and has been authorized for detection and/or diagnosis of SARS-CoV-2 by FDA under an Emergency Use Authorization (EUA). This EUA will remain in effect (meaning this test can be used) for the duration of the COVID-19 declaration under Section 564(b)(1) of the Act, 21 U.S.C. section 360bbb-3(b)(1), unless the authorization is terminated or revoked.  Performed at [facility], [address], [place], [place] [address]     Blood Alcohol level:  Lab Results  Component Value Date   [facility] <10 51/76/1607    Metabolic Disorder Labs:  No results found for: "HGBA1C", "MPG" No results found for: "PROLACTIN" No results found for: "CHOL", "TRIG", "HDL", "CHOLHDL", "VLDL", "LDLCALC"  Current Medications: Current Facility-Administered Medications  Medication Dose Route Frequency Provider Last Rate Last Admin   albuterol (VENTOLIN HFA) 108 (90 Base) MCG/ACT inhaler 2 puff  2 puff Inhalation Q6H PRN Caroline Sauger, NP       Derrill Memo ON 02/01/2022] amphetamine-dextroamphetamine (ADDERALL) tablet 20 mg  20 mg Oral Q breakfast Quanasia Defino, Madie Reno, MD       buPROPion (WELLBUTRIN XL) 24 hr tablet 300 mg  300 mg Oral Daily Caroline Sauger, NP   300 mg at 01/31/22 3710   dicyclomine (BENTYL) capsule 10 mg  10 mg Oral QID PRN Caroline Sauger, NP       famotidine (PEPCID) tablet 20 mg  20 mg Oral BID Caroline Sauger, NP   20 mg at 01/31/22 0837   fluticasone (FLONASE) 50 MCG/ACT nasal  spray 2 spray  2 spray Each Nare Daily PRN Caroline Sauger, NP       lubiprostone (AMITIZA) capsule 24 mcg  24 mcg Oral BID WC Caroline Sauger, NP       magnesium hydroxide (MILK OF MAGNESIA) suspension 30 mL  30 mL Oral Daily PRN Caroline Sauger, NP       montelukast (SINGULAIR) tablet 10 mg  10 mg Oral QHS Caroline Sauger, NP       nicotine (NICODERM CQ - dosed in mg/24 hours) patch 14 mg  14 mg Transdermal Daily Tyreesha Maharaj T, MD   14 mg at 01/31/22 6269   simvastatin (ZOCOR) tablet 20 mg  20 mg Oral QHS Caroline Sauger, NP       SUMAtriptan (IMITREX) tablet 50 mg  50 mg Oral Once PRN Caroline Sauger, NP       topiramate (TOPAMAX) tablet 75 mg  75 mg Oral QHS Caroline Sauger, NP       PTA Medications: Medications Prior to Admission  Medication Sig Dispense Refill Last Dose   albuterol (PROVENTIL HFA;VENTOLIN HFA) 108 (90  Base) MCG/ACT inhaler Inhale 2 puffs into the lungs every 6 (six) hours as needed.       AMITIZA 24 MCG capsule TAKE 1 CAPSULE BY MOUTH ONCE DAILY IN THE MORNING AND 2 CAPSULES IN THE EVENING      amphetamine-dextroamphetamine (ADDERALL) 20 MG tablet Take 20 mg by mouth 2 (two) times daily.      benzonatate (TESSALON) 100 MG capsule Take 1 capsule (100 mg total) by mouth every 8 (eight) hours. 21 capsule 0    buPROPion (WELLBUTRIN XL) 300 MG 24 hr tablet Take 300 mg by mouth daily. Now taking 200 mg BID  0    cetirizine-pseudoephedrine (ZYRTEC-D) 5-120 MG tablet Take 1 tablet by mouth daily. 30 tablet 0    diazepam (VALIUM) 10 MG tablet Take 10 mg by mouth 4 (four) times daily as needed for anxiety.      dicyclomine (BENTYL) 10 MG capsule Take 10 mg by mouth 4 (four) times daily as needed.       esomeprazole (NEXIUM) 40 MG capsule Take 40 mg by mouth daily at 12 noon.      fluticasone (FLONASE) 50 MCG/ACT nasal spray Place 2 sprays into the nose daily.       meclizine (ANTIVERT) 25 MG tablet Take by mouth.      montelukast (SINGULAIR) 10  MG tablet Take 10 mg by mouth at bedtime.       ranitidine (ZANTAC) 150 MG tablet Take 150 mg by mouth every morning.      rizatriptan (MAXALT) 10 MG tablet Take 1 tablet earliest onset of migraine.  May repeat in 2 hours if needed.  Maximum 2 tablets in 24 hours 10 tablet 0    simvastatin (ZOCOR) 20 MG tablet Take 20 mg by mouth at bedtime.      tiZANidine (ZANAFLEX) 2 MG tablet Take 1 tablet (2 mg total) by mouth at bedtime. 30 tablet 0    topiramate (TOPAMAX) 50 MG tablet TAKE 1 & 1/2 (ONE & ONE-HALF) TABLETS BY MOUTH AT BEDTIME 45 tablet 0    zolpidem (AMBIEN) 10 MG tablet Take 10 mg by mouth at bedtime.       Musculoskeletal: Strength & Muscle Tone: within normal limits Gait & Station: unsteady Patient leans: N/A            Psychiatric Specialty Exam:  Presentation  General Appearance:  Bizarre  Eye Contact: Good  Speech: Clear and Coherent  Speech Volume: Normal  Handedness: Right   Mood and Affect  Mood: Irritable; Depressed  Affect: Depressed; Inappropriate   Thought Process  Thought Processes: Coherent  Duration of Psychotic Symptoms:N/A Past Diagnosis of Schizophrenia or Psychoactive disorder: No  Descriptions of Associations:Loose  Orientation:Full (Time, Place and Person)  Thought Content:Logical  Hallucinations:Hallucinations: None  Ideas of Reference:None  Suicidal Thoughts:Suicidal Thoughts: No  Homicidal Thoughts:Homicidal Thoughts: No   Sensorium  Memory: Immediate Good; Recent Good; Remote Good  Judgment: Poor  Insight: Poor   Executive Functions  Concentration: Fair  Attention Span: Fair  Recall: Fair  Fund of Knowledge: Fair  Language: Good   Psychomotor Activity  Psychomotor Activity: Psychomotor Activity: Decreased   Assets  Assets: Communication Skills; Desire for Improvement; Resilience; Social Support   Sleep  Sleep: Sleep: Good Number of Hours of Sleep: 8    Physical  Exam: Physical Exam Vitals and nursing note reviewed.  Constitutional:      Appearance: Normal appearance.  HENT:     Head: Normocephalic and atraumatic.  Mouth/Throat:     Pharynx: Oropharynx is clear.  Eyes:     Pupils: Pupils are equal, round, and reactive to light.  Cardiovascular:     Rate and Rhythm: Normal rate and regular rhythm.  Pulmonary:     Effort: Pulmonary effort is normal.     Breath sounds: Normal breath sounds.  Abdominal:     General: Abdomen is flat.     Palpations: Abdomen is soft.  Musculoskeletal:        General: Normal range of motion.     Comments: Full examination not done but patient walks with a walker bent over a bit to the right and complains of chronic pain especially on the right side and in her right leg.  Skin:    General: Skin is warm and dry.  Neurological:     General: No focal deficit present.     Mental Status: She is alert. Mental status is at baseline.  Psychiatric:        Attention and Perception: She is inattentive.        Mood and Affect: Mood is anxious.        Speech: Speech is tangential.        Behavior: Behavior is slowed.        Thought Content: Thought content normal. Thought content does not include homicidal or suicidal ideation.        Cognition and Memory: Cognition is impaired. Memory is impaired.        Judgment: Judgment is inappropriate.    Review of Systems  Constitutional: Negative.   HENT: Negative.    Eyes: Negative.   Respiratory: Negative.    Cardiovascular: Negative.   Gastrointestinal: Negative.   Musculoskeletal:  Positive for back pain, joint pain, myalgias and neck pain.  Skin: Negative.   Neurological: Negative.    Blood pressure 116/70, pulse 72, temperature 97.8 F (36.6 C), temperature source Oral, resp. rate 18, height 5\' 5"  (1.651 m), weight 69 kg, SpO2 100 %. Body mass index is 25.31 kg/m.  Treatment Plan Summary: Daily contact with patient to assess and evaluate symptoms and progress  in treatment, Medication management, and Plan patient insistent that she is not suicidal but during our interview she is tearful at times seems very scattered in her thinking and often seems to be slurring her speech.  I am concerned about lingering effects of the diazepam.  I am also concerned given what the younger daughter reported to me about the possible suicide note.  Patient will continue under 15-minute checks and continue observation here in the hospital.  We are withholding Valium for now.  I will restart the Adderall tomorrow but add a smaller dose than her normal prescription.  Reassess and possible discharge tomorrow with psychoeducation and support.  No other change to psychiatric medicines for now.  Observation Level/Precautions:  15 minute checks  Laboratory:  Chemistry Profile  Psychotherapy:    Medications:    Consultations:    Discharge Concerns:    Estimated LOS:  Other:     Physician Treatment Plan for Primary Diagnosis: Overdose of benzodiazepine Long Term Goal(s): Improvement in symptoms so as ready for discharge  Short Term Goals: Ability to verbalize feelings will improve, Ability to disclose and discuss suicidal ideas, and Ability to demonstrate self-control will improve  Physician Treatment Plan for Secondary Diagnosis: Principal Problem:   Overdose of benzodiazepine Active Problems:   Low back pain   Neck pain   Irritable bowel syndrome   Lumbar  spondylosis   MDD (major depressive disorder), recurrent episode, moderate (HCC)  Long Term Goal(s): Improvement in symptoms so as ready for discharge  Short Term Goals: Ability to identify and develop effective coping behaviors will improve and Ability to maintain clinical measurements within normal limits will improve  I certify that inpatient services furnished can reasonably be expected to improve the patient's condition.    Mordecai Rasmussen, MD 1/24/20241:54 PM

## 2022-01-31 NOTE — ED Notes (Signed)
Pt moved to ED room 22. Pt is already in behavioral scrubs, walks by self with walker. Pt tearful as she fears for family at home. Pt still questioning plan of care and IVC status, pt educated by this nurse

## 2022-01-31 NOTE — ED Notes (Signed)
Pt up out of bed, standing in hallway.  Pt c/o increased noise outside of her room, stating that it is causing her extreme anxiety and preventing her from resting.  Pt also stating that she does not have mental problems, and does not need to be IVC'd.  Pt stating she wants her daughter, who is a Engineer, structural, be called to come and get her.  Pt made aware that we can call her daughter, but that she can have no visitors at this time.  Pt redirected to room at this time by ED staff.

## 2022-01-31 NOTE — BH IP Treatment Plan (Unsigned)
Interdisciplinary Treatment and Diagnostic Plan Update  01/31/2022 Time of Session: 9:00AM Olivia Young MRN: [id number]  Principal Diagnosis: MDD (major depressive disorder), recurrent episode, moderate (HCC)  Secondary Diagnoses: Principal Problem:   MDD (major depressive disorder), recurrent episode, moderate (HCC)   Current Medications:  Current Facility-Administered Medications  Medication Dose Route Frequency Provider Last Rate Last Admin   albuterol (VENTOLIN HFA) 108 (90 Base) MCG/ACT inhaler 2 puff  2 puff Inhalation Q6H PRN Gillermo Murdoch, NP       buPROPion (WELLBUTRIN XL) 24 hr tablet 300 mg  300 mg Oral Daily Gillermo Murdoch, NP   300 mg at 01/31/22 5852   dicyclomine (BENTYL) capsule 10 mg  10 mg Oral QID PRN Gillermo Murdoch, NP       famotidine (PEPCID) tablet 20 mg  20 mg Oral BID Gillermo Murdoch, NP   20 mg at 01/31/22 0837   fluticasone (FLONASE) 50 MCG/ACT nasal spray 2 spray  2 spray Each Nare Daily PRN Gillermo Murdoch, NP       lubiprostone (AMITIZA) capsule 24 mcg  24 mcg Oral BID WC Gillermo Murdoch, NP       magnesium hydroxide (MILK OF MAGNESIA) suspension 30 mL  30 mL Oral Daily PRN Gillermo Murdoch, NP       montelukast (SINGULAIR) tablet 10 mg  10 mg Oral QHS Gillermo Murdoch, NP       nicotine (NICODERM CQ - dosed in mg/24 hours) patch 14 mg  14 mg Transdermal Daily Clapacs, Jackquline Denmark, MD   14 mg at 01/31/22 7782   simvastatin (ZOCOR) tablet 20 mg  20 mg Oral QHS Gillermo Murdoch, NP       SUMAtriptan (IMITREX) tablet 50 mg  50 mg Oral Once PRN Gillermo Murdoch, NP       topiramate (TOPAMAX) tablet 75 mg  75 mg Oral QHS Gillermo Murdoch, NP       PTA Medications: Medications Prior to Admission  Medication Sig Dispense Refill Last Dose   albuterol (PROVENTIL HFA;VENTOLIN HFA) 108 (90 Base) MCG/ACT inhaler Inhale 2 puffs into the lungs every 6 (six) hours as needed.       AMITIZA 24 MCG capsule TAKE 1 CAPSULE  BY MOUTH ONCE DAILY IN THE MORNING AND 2 CAPSULES IN THE EVENING      amphetamine-dextroamphetamine (ADDERALL) 20 MG tablet Take 20 mg by mouth 2 (two) times daily.      benzonatate (TESSALON) 100 MG capsule Take 1 capsule (100 mg total) by mouth every 8 (eight) hours. 21 capsule 0    buPROPion (WELLBUTRIN XL) 300 MG 24 hr tablet Take 300 mg by mouth daily. Now taking 200 mg BID  0    cetirizine-pseudoephedrine (ZYRTEC-D) 5-120 MG tablet Take 1 tablet by mouth daily. 30 tablet 0    diazepam (VALIUM) 10 MG tablet Take 10 mg by mouth 4 (four) times daily as needed for anxiety.      dicyclomine (BENTYL) 10 MG capsule Take 10 mg by mouth 4 (four) times daily as needed.       esomeprazole (NEXIUM) 40 MG capsule Take 40 mg by mouth daily at 12 noon.      fluticasone (FLONASE) 50 MCG/ACT nasal spray Place 2 sprays into the nose daily.       meclizine (ANTIVERT) 25 MG tablet Take by mouth.      montelukast (SINGULAIR) 10 MG tablet Take 10 mg by mouth at bedtime.       ranitidine (ZANTAC) 150 MG tablet Take 150  mg by mouth every morning.      rizatriptan (MAXALT) 10 MG tablet Take 1 tablet earliest onset of migraine.  May repeat in 2 hours if needed.  Maximum 2 tablets in 24 hours 10 tablet 0    simvastatin (ZOCOR) 20 MG tablet Take 20 mg by mouth at bedtime.      tiZANidine (ZANAFLEX) 2 MG tablet Take 1 tablet (2 mg total) by mouth at bedtime. 30 tablet 0    topiramate (TOPAMAX) 50 MG tablet TAKE 1 & 1/2 (ONE & ONE-HALF) TABLETS BY MOUTH AT BEDTIME 45 tablet 0    zolpidem (AMBIEN) 10 MG tablet Take 10 mg by mouth at bedtime.       Patient Stressors: Health problems   Loss of Mom & Brother   Marital or family conflict    Patient Strengths: Electronics engineer  Supportive family/friends   Treatment Modalities: Medication Management, Group therapy, Case management,  1 to 1 session with clinician, Psychoeducation, Recreational therapy.   Physician Treatment Plan for Primary  Diagnosis: MDD (major depressive disorder), recurrent episode, moderate ([place]) Long Term Goal(s):     Short Term Goals:    Medication Management: Evaluate patient's response, side effects, and tolerance of medication regimen.  Therapeutic Interventions: 1 to 1 sessions, Unit Group sessions and Medication administration.  Evaluation of Outcomes: Not Met  Physician Treatment Plan for Secondary Diagnosis: Principal Problem:   MDD (major depressive disorder), recurrent episode, moderate ([place])  Long Term Goal(s):     Short Term Goals:       Medication Management: Evaluate patient's response, side effects, and tolerance of medication regimen.  Therapeutic Interventions: 1 to 1 sessions, Unit Group sessions and Medication administration.  Evaluation of Outcomes: Not Met   RN Treatment Plan for Primary Diagnosis: MDD (major depressive disorder), recurrent episode, moderate (HCC) Long Term Goal(s): Knowledge of disease and therapeutic regimen to maintain health will improve  Short Term Goals: Ability to demonstrate self-control, Ability to participate in decision making will improve, Ability to verbalize feelings will improve, Ability to disclose and discuss suicidal ideas, Ability to identify and develop effective coping behaviors will improve, and Compliance with prescribed medications will improve  Medication Management: RN will administer medications as ordered by provider, will assess and evaluate patient's response and provide education to patient for prescribed medication. RN will report any adverse and/or side effects to prescribing provider.  Therapeutic Interventions: 1 on 1 counseling sessions, Psychoeducation, Medication administration, Evaluate responses to treatment, Monitor vital signs and CBGs as ordered, Perform/monitor CIWA, COWS, AIMS and Fall Risk screenings as ordered, Perform wound care treatments as ordered.  Evaluation of Outcomes: Not Met   LCSW Treatment Plan for  Primary Diagnosis: MDD (major depressive disorder), recurrent episode, moderate ([place]) Long Term Goal(s): Safe transition to appropriate next level of care at discharge, Engage patient in therapeutic group addressing interpersonal concerns.  Short Term Goals: Engage patient in aftercare planning with referrals and resources, Increase social support, Increase ability to appropriately verbalize feelings, Increase emotional regulation, Facilitate acceptance of mental health diagnosis and concerns, and Increase skills for wellness and recovery  Therapeutic Interventions: Assess for all discharge needs, 1 to 1 time with Social worker, Explore available resources and support systems, Assess for adequacy in community support network, Educate family and significant other(s) on suicide prevention, Complete Psychosocial Assessment, Interpersonal group therapy.  Evaluation of Outcomes: Not Met   Progress in Treatment: Attending groups: No. Participating in groups: No. Taking medication as prescribed: Yes. Toleration  medication: Yes. Family/Significant other contact made: No, will contact:  once permission is given. Patient understands diagnosis: Yes. Discussing patient identified problems/goals with staff: Yes. Medical problems stabilized or resolved: Yes. Denies suicidal/homicidal ideation: Yes. Issues/concerns per patient self-inventory: No. Other: none  New problem(s) identified: No, Describe:  none  New Short Term/Long Term Goal(s): medication management for mood stabilization; elimination of SI thoughts; development of comprehensive mental wellness plan.   Patient Goals:  "try to do things differently"  Discharge Plan or Barriers: CSW to assist patient in development of appropriate discharge plans.   Reason for Continuation of Hospitalization: Anxiety Depression Medical Issues Medication stabilization Suicidal ideation  Estimated Length of Stay:  1-7 days  Last 3 [place] Suicide  Severity Risk Score: [place] Admission (Current) from 01/31/2022 in [place] ED from 01/30/2022 in [facility] Emergency Department at [place] High Risk High Risk       Last PHQ 2/9 Scores:     No data to display          Scribe for Treatment Team: Rozann Lesches, LCSW 01/31/2022 11:23 AM

## 2022-01-31 NOTE — Plan of Care (Signed)
D: Patient alert and oriented. Patient endorses lower back pain 8/10. Patient endorses anxiety and depression. Patient denies SI/HI/AVH. Patient irritable and tearful during the morning. As the day went on around lunch patient apologized for behavior and has been pleasant the rest of the shift.  Patient has been isolative to room with exception to coming out for meals and medication.   A: Scheduled medications administered to patient, per MD orders. Patient did refuse scheduled Amitiza stating that she has not taken this medication in years. Support and encouragement provided to patient.  Q15 minute safety checks maintained.   R: Patient compliant with medication administration and treatment plan. No adverse drug reactions noted. Patient remains safe on the unit at this time. Problem: Education: Goal: Knowledge of [place] [facility] Education information/materials will improve Outcome: Progressing Goal: Verbalization of understanding the information provided will improve Outcome: Progressing   Problem: Education: Goal: Knowledge of [place] [facility] Education information/materials will improve Outcome: Progressing Goal: Verbalization of understanding the information provided will improve Outcome: Progressing

## 2022-01-31 NOTE — ED Notes (Signed)
Spoke with Dr. Leonides Schanz in reference to pt being medically cleared and her current BP. Dr Leonides Schanz states she was not notified that pt was not medically cleared when she arrived for her shift. States for nurse to verify with [facility] that pt is clear through them and if they state they are in no need for further care then she is cleared. Dr. Leonides Schanz also states that pt BP is appropriate as she has a normal heart rate.  This nurse spoke to [place] with [employer] [facility]. There was no case opened prior to this nurse contact so case was opened. After discussing pt care and events to now he reports that there is no need for further treatment medically and pt is cleared for next step of care.  Pt medically stable for psych disposition after collecting this information.

## 2022-01-31 NOTE — ED Provider Notes (Signed)
Emergency Medicine Observation Re-evaluation Note  Olivia Young is a 57 y.o. female, seen on rounds today.  Pt initially presented to the ED for complaints of Ingestion  Currently, the patient is is no acute distress. Denies any concerns at this time.  Physical Exam  Blood pressure (!) 91/51, pulse 82, temperature 97.7 F (36.5 C), temperature source Oral, resp. rate 20, SpO2 97 %.  Physical Exam: General: No apparent distress Pulm: Normal WOB Neuro: Moving all extremities Psych: Resting comfortably     ED Course / MDM     I have reviewed the labs performed to date as well as medications administered while in observation.  Recent changes in the last 24 hours include: No acute events overnight.  Plan   Current plan: Patient awaiting psychiatric disposition. Patient is under full IVC at this time.    Kyrielle Urbanski, Delice Bison, DO 01/31/22 [phone]

## 2022-02-01 DIAGNOSIS — T424X4D Poisoning by benzodiazepines, undetermined, subsequent encounter: Secondary | ICD-10-CM | POA: Diagnosis not present

## 2022-02-01 MED ORDER — NICOTINE 14 MG/24HR TD PT24: 14.0000 mg | MEDICATED_PATCH | Freq: Every day | TRANSDERMAL | 0 refills | Status: AC
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
## 2022-02-01 NOTE — Progress Notes (Signed)
Patient ID: Olivia Young, female   DOB: 05-20-1965, 57 y.o.   MRN: [id number] Patient denies SI/HI/AVH. Belongings were returned to patient. Discharge instructions  including medication and follow up information were reviewed with patient and understanding was verbalized. Patient was not observed to be in distress at time of discharge. Patient was escorted out with staff to medical mall to be transported home by family member.

## 2022-02-01 NOTE — BHH Suicide Risk Assessment (Signed)
[place] [place] INPATIENT:  Family/Significant Other Suicide Prevention Education  Suicide Prevention Education:  Patient Refusal for Family/Significant Other Suicide Prevention Education: The patient Olivia Young has refused to provide written consent for family/significant other to be provided Family/Significant Other Suicide Prevention Education during admission and/or prior to discharge.  Physician notified.  SPE completed with pt, as pt refused to consent to family contact. SPI pamphlet provided to pt and pt was encouraged to share information with support network, ask questions, and talk about any concerns relating to SPE. Pt denies access to guns/firearms and verbalized understanding of information provided. Mobile Crisis information also provided to pt.  Shirl Harris 02/01/2022, 1:17 PM

## 2022-02-01 NOTE — Discharge Summary (Signed)
Physician Discharge Summary Note  Patient:  Olivia Young is an 57 y.o., female MRN:  [id number] DOB:  January 31, 1965 Patient phone:  [phone] (home)  Patient address:   [address] [place] [address],  Total Time spent with patient: 30 minutes  Date of Admission:  01/31/2022 Date of Discharge: 02/01/2022  Reason for Admission: Admitted through the emergency room where she presented by EMS with altered mental status sedated and concern about overdose of medication.  Concern raised about possible intentional overdose leading to admission under IVC.  Principal Problem: Overdose of benzodiazepine Discharge Diagnoses: Principal Problem:   Overdose of benzodiazepine Active Problems:   Low back pain   Neck pain   Irritable bowel syndrome   Lumbar spondylosis   MDD (major depressive disorder), recurrent episode, moderate (HCC)   Past Psychiatric History: Patient has a longstanding history of chronic anxiety and depression.  Sees an outpatient psychiatrist regularly.  Confirmed that she is prescribed diazepam 10 mg 4 times a day as well as Ambien as well as Adderall.  Past Medical History:  Past Medical History:  Diagnosis Date   Chronic neck and back pain    Diverticula, colon    History of kidney stones    Migraine headache    Polyarthralgia    Vestibular migraine     Past Surgical History:  Procedure Laterality Date   ABDOMINAL HYSTERECTOMY     BACK SURGERY     SHOULDER SURGERY     Family History:  Family History  Problem Relation Age of Onset   Stroke Mother    Depression Mother    CAD Mother    CVA Mother    Hypertension Mother    Heart disease Father    Heart attack Father 73   Diabetes Sister 7   Heart attack Brother 3   Other Brother 45       MVA   Breast cancer Neg Hx    Family Psychiatric  History: Has a daughter with autistic spectrum disorder Social History:  Social History   Substance and Sexual Activity  Alcohol Use No      Social History   Substance and Sexual Activity  Drug Use Never    Social History   Socioeconomic History   Marital status: Divorced    Spouse name: Not on file   Number of children: 3   Years of education: Not on file   Highest education level: Associate degree: occupational, Hotel manager, or vocational program  Occupational History    Employer: DISABLED  Tobacco Use   Smoking status: Every Day    Packs/day: 0.50    Years: 30.00    Total pack years: 15.00    Types: Cigarettes   Smokeless tobacco: Never  Vaping Use   Vaping Use: Every day  Substance and Sexual Activity   Alcohol use: No   Drug use: Never   Sexual activity: Not on file  Other Topics Concern   Not on file  Social History Narrative   Patient is left-handed. She lives alone in a one level house. She drinks 2-3 cups of coffee a day, and rare tea or soda. She works in her yard.   Education - some college   Social Determinants of Health   Financial Resource Strain: Not on file  Food Insecurity: No Food Insecurity (01/31/2022)   Hunger Vital Sign    Worried About Running Out of Food in the Last Year: Never true    Ran Out of  Food in the Last Year: Never true  Transportation Needs: No Transportation Needs (01/31/2022)   PRAPARE - Hydrologist (Medical): No    Lack of Transportation (Non-Medical): No  Physical Activity: Not on file  Stress: Not on file  Social Connections: Not on file    Hospital Course: Admitted to psychiatric unit.  15-minute checks continued.  Patient did not display any dangerous behavior in the hospital.  She met with treatment team and with individual providers.  Patient consistently denied any suicidal intent.  Her actual story describing what she believed happened that led to her hospitalization changed multiple times and remained inconsistent even at discharge.  Patient expressed displeasure at discharge and not having been provided with sedating medicine  during her hospitalization overnight.  It was explained to her that we were concerned that she was over sedated even when I spoke with her yesterday and wanted to make sure that she was detoxed.  Patient has been provided with psychoeducation to be careful with the use of her medication because of the risk of oversedation and dangerous outcomes including fall or breathing difficulties.  Encouraged to discuss her medication usage with her primary psychiatrist.  No new prescriptions other than for tobacco cessation will be provided at discharge.  Patient is referred back to her outpatient provider.  I did speak with her daughter, one of the ones who is not impaired, while the patient was in the hospital who told a lucid and clear version of the events that supported the rationale for admission.  I explained this to the patient who adamantly disagreed with what her daughter told me.  In any case at the time of discharge patient does not appear to be at elevated risk of self-harm nor to require further inpatient hospitalization.  Physical Findings: AIMS:  , ,  ,  ,    CIWA:    COWS:     Musculoskeletal: Strength & Muscle Tone: decreased Gait & Station: unsteady Patient leans: Right   Psychiatric Specialty Exam:  Presentation  General Appearance:  Bizarre  Eye Contact: Good  Speech: Clear and Coherent  Speech Volume: Normal  Handedness: Right   Mood and Affect  Mood: Irritable; Depressed  Affect: Depressed; Inappropriate   Thought Process  Thought Processes: Coherent  Descriptions of Associations:Loose  Orientation:Full (Time, Place and Person)  Thought Content:Logical  History of Schizophrenia/Schizoaffective disorder:No  Duration of Psychotic Symptoms:No data recorded Hallucinations:No data recorded Ideas of Reference:None  Suicidal Thoughts:No data recorded Homicidal Thoughts:No data recorded  Sensorium  Memory: Immediate Good; Recent Good; Remote  Good  Judgment: Poor  Insight: Poor   Executive Functions  Concentration: Fair  Attention Span: Fair  Recall: [place] of Knowledge: Fair  Language: Good   Psychomotor Activity  Psychomotor Activity:No data recorded  Assets  Assets: Communication Skills; Desire for Improvement; Resilience; Social Support   Sleep  Sleep:No data recorded   Physical Exam: Physical Exam Vitals and nursing note reviewed.  Constitutional:      Appearance: Normal appearance.  HENT:     Head: Normocephalic and atraumatic.     Mouth/Throat:     Pharynx: Oropharynx is clear.  Eyes:     Pupils: Pupils are equal, round, and reactive to light.  Cardiovascular:     Rate and Rhythm: Normal rate and regular rhythm.  Pulmonary:     Effort: Pulmonary effort is normal.     Breath sounds: Normal breath sounds.  Abdominal:  General: Abdomen is flat.     Palpations: Abdomen is soft.  Musculoskeletal:        General: Normal range of motion.     Comments: Patient uses a walker for ambulation and reports chronic back and hip pain  Skin:    General: Skin is warm and dry.  Neurological:     General: No focal deficit present.     Mental Status: She is alert. Mental status is at baseline.  Psychiatric:        Attention and Perception: Attention normal.        Mood and Affect: Mood is anxious.        Speech: Speech normal.        Behavior: Behavior is agitated. Behavior is not aggressive.        Thought Content: Thought content normal.        Cognition and Memory: Memory is impaired.        Judgment: Judgment is impulsive.    Review of Systems  Constitutional: Negative.   HENT: Negative.    Eyes: Negative.   Respiratory: Negative.    Cardiovascular: Negative.   Gastrointestinal: Negative.   Musculoskeletal:  Positive for back pain, joint pain, myalgias and neck pain.  Skin: Negative.   Neurological: Negative.   Psychiatric/Behavioral:  Positive for depression. Negative for  hallucinations, substance abuse and suicidal ideas. The patient is nervous/anxious and has insomnia.    Blood pressure 113/71, pulse 79, temperature 98.1 F (36.7 C), temperature source Oral, resp. rate 18, height 5\' 5"  (1.651 m), weight 69 kg, SpO2 99 %. Body mass index is 25.31 kg/m.   Social History   Tobacco Use  Smoking Status Every Day   Packs/day: 0.50   Years: 30.00   Total pack years: 15.00   Types: Cigarettes  Smokeless Tobacco Never   Tobacco Cessation:  A prescription for an FDA-approved tobacco cessation medication provided at discharge   Blood Alcohol level:  Lab Results  Component Value Date   ETH <10 01/30/2022    Metabolic Disorder Labs:  No results found for: "HGBA1C", "MPG" No results found for: "PROLACTIN" No results found for: "CHOL", "TRIG", "HDL", "CHOLHDL", "VLDL", "LDLCALC"  See Psychiatric Specialty Exam and Suicide Risk Assessment completed by Attending Physician prior to discharge.  Discharge destination:  Home  Is patient on multiple antipsychotic therapies at discharge:  No   Has Patient had three or more failed trials of antipsychotic monotherapy by history:  No  Recommended Plan for Multiple Antipsychotic Therapies: NA  Discharge Instructions     Diet - low sodium heart healthy   Complete by: As directed    Increase activity slowly   Complete by: As directed       Allergies as of 02/01/2022       Reactions   Acetaminophen Nausea And Vomiting   Aspirin Nausea And Vomiting   Acetaminophen-codeine Nausea Only   Codeine Nausea Only   Penicillins Rash, Nausea And Vomiting   Has patient had a PCN reaction causing immediate rash, facial/tongue/throat swelling, SOB or lightheadedness with hypotension: Yes Has patient had a PCN reaction causing severe rash involving mucus membranes or skin necrosis: No Has patient had a PCN reaction that required hospitalization: No Has patient had a PCN reaction occurring within the last 10 years:  No If all of the above answers are "NO", then may proceed with Cephalosporin use.        Medication List     STOP taking these medications  Amitiza 24 MCG capsule Generic drug: lubiprostone   benzonatate 100 MG capsule Commonly known as: TESSALON   ranitidine 150 MG tablet Commonly known as: ZANTAC   topiramate 50 MG tablet Commonly known as: TOPAMAX       TAKE these medications      Indication  albuterol 108 (90 Base) MCG/ACT inhaler Commonly known as: VENTOLIN HFA Inhale 2 puffs into the lungs every 6 (six) hours as needed.  Indication: Asthma   amphetamine-dextroamphetamine 20 MG tablet Commonly known as: ADDERALL Take 20 mg by mouth 2 (two) times daily.  Indication: Attention Deficit Hyperactivity Disorder   buPROPion 300 MG 24 hr tablet Commonly known as: WELLBUTRIN XL Take 300 mg by mouth daily. Now taking 200 mg BID  Indication: Major Depressive Disorder   cetirizine-pseudoephedrine 5-120 MG tablet Commonly known as: ZYRTEC-D Take 1 tablet by mouth daily.  Indication: Perennial Allergic Rhinitis   diazepam 10 MG tablet Commonly known as: VALIUM Take 10 mg by mouth 4 (four) times daily as needed for anxiety.  Indication: Feeling Anxious   dicyclomine 10 MG capsule Commonly known as: BENTYL Take 10 mg by mouth 4 (four) times daily as needed.  Indication: Irritable Bowel Syndrome   esomeprazole 40 MG capsule Commonly known as: NEXIUM Take 40 mg by mouth daily at 12 noon.  Indication: Gastroesophageal Reflux Disease   fluticasone 50 MCG/ACT nasal spray Commonly known as: FLONASE Place 2 sprays into the nose daily.  Indication: Allergic Rhinitis   meclizine 25 MG tablet Commonly known as: ANTIVERT Take by mouth.  Indication: Dizzy   montelukast 10 MG tablet Commonly known as: SINGULAIR Take 10 mg by mouth at bedtime.  Indication: Asthma   nicotine 14 mg/24hr patch Commonly known as: NICODERM CQ - dosed in mg/24 hours Place 1 patch  (14 mg total) onto the skin daily. Start taking on: February 02, 2022  Indication: Nicotine Addiction   rizatriptan 10 MG tablet Commonly known as: Maxalt Take 1 tablet earliest onset of migraine.  May repeat in 2 hours if needed.  Maximum 2 tablets in 24 hours  Indication: Migraine Headache   simvastatin 20 MG tablet Commonly known as: ZOCOR Take 20 mg by mouth at bedtime.  Indication: High Amount of Triglycerides in the Blood   tiZANidine 2 MG tablet Commonly known as: ZANAFLEX Take 1 tablet (2 mg total) by mouth at bedtime.  Indication: Muscle Spasticity   zolpidem 10 MG tablet Commonly known as: AMBIEN Take 10 mg by mouth at bedtime.  Indication: Trouble Sleeping         Follow-up recommendations:  Other:  Recommend follow-up with regular outpatient psychiatrist.  Recommend follow-up with a therapist for what sounds like chronic anxiety and dysphoria related to multiple life stresses.  Comments: See above  Signed: Alethia Berthold, MD 02/01/2022, 10:24 AM

## 2022-02-01 NOTE — Plan of Care (Signed)
Problem: Education: Goal: Knowledge of General Education information will improve Description: Including pain rating scale, medication(s)/side effects and non-pharmacologic comfort measures Outcome: Adequate for Discharge   Problem: Health Behavior/Discharge Planning: Goal: Ability to manage health-related needs will improve Outcome: Adequate for Discharge   Problem: Clinical Measurements: Goal: Ability to maintain clinical measurements within normal limits will improve Outcome: Adequate for Discharge Goal: Will remain free from infection Outcome: Adequate for Discharge Goal: Diagnostic test results will improve Outcome: Adequate for Discharge Goal: Respiratory complications will improve Outcome: Adequate for Discharge Goal: Cardiovascular complication will be avoided Outcome: Adequate for Discharge   Problem: Activity: Goal: Risk for activity intolerance will decrease Outcome: Adequate for Discharge   Problem: Nutrition: Goal: Adequate nutrition will be maintained Outcome: Adequate for Discharge   Problem: Coping: Goal: Level of anxiety will decrease Outcome: Adequate for Discharge   Problem: Elimination: Goal: Will not experience complications related to bowel motility Outcome: Adequate for Discharge Goal: Will not experience complications related to urinary retention Outcome: Adequate for Discharge   Problem: Pain Managment: Goal: General experience of comfort will improve Outcome: Adequate for Discharge   Problem: Safety: Goal: Ability to remain free from injury will improve Outcome: Adequate for Discharge   Problem: Skin Integrity: Goal: Risk for impaired skin integrity will decrease Outcome: Adequate for Discharge   Problem: Education: Goal: Knowledge of [place] General Education information/materials will improve 02/01/2022 1053 by Donette Larry, RN Outcome: Adequate for Discharge 02/01/2022 0936 by Donette Larry, RN Outcome: Not  Progressing Goal: Emotional status will improve Outcome: Adequate for Discharge Goal: Mental status will improve Outcome: Adequate for Discharge Goal: Verbalization of understanding the information provided will improve 02/01/2022 1053 by Donette Larry, RN Outcome: Adequate for Discharge 02/01/2022 0936 by Donette Larry, RN Outcome: Not Progressing   Problem: Activity: Goal: Interest or engagement in activities will improve Outcome: Adequate for Discharge Goal: Sleeping patterns will improve Outcome: Adequate for Discharge   Problem: Coping: Goal: Ability to verbalize frustrations and anger appropriately will improve Outcome: Adequate for Discharge Goal: Ability to demonstrate self-control will improve Outcome: Adequate for Discharge   Problem: Health Behavior/Discharge Planning: Goal: Identification of resources available to assist in meeting health care needs will improve Outcome: Adequate for Discharge Goal: Compliance with treatment plan for underlying cause of condition will improve Outcome: Adequate for Discharge   Problem: Physical Regulation: Goal: Ability to maintain clinical measurements within normal limits will improve Outcome: Adequate for Discharge   Problem: Safety: Goal: Periods of time without injury will increase Outcome: Adequate for Discharge   Problem: Education: Goal: Knowledge of [place] General Education information/materials will improve Outcome: Adequate for Discharge Goal: Emotional status will improve Outcome: Adequate for Discharge Goal: Mental status will improve Outcome: Adequate for Discharge Goal: Verbalization of understanding the information provided will improve Outcome: Adequate for Discharge   Problem: Activity: Goal: Interest or engagement in activities will improve Outcome: Adequate for Discharge Goal: Sleeping patterns will improve Outcome: Adequate for Discharge   Problem: Coping: Goal: Ability to verbalize  frustrations and anger appropriately will improve Outcome: Adequate for Discharge Goal: Ability to demonstrate self-control will improve Outcome: Adequate for Discharge   Problem: Health Behavior/Discharge Planning: Goal: Identification of resources available to assist in meeting health care needs will improve Outcome: Adequate for Discharge Goal: Compliance with treatment plan for underlying cause of condition will improve Outcome: Adequate for Discharge   Problem: Physical Regulation: Goal: Ability to maintain clinical measurements within normal limits will improve Outcome:  Adequate for Discharge   Problem: Safety: Goal: Periods of time without injury will increase Outcome: Adequate for Discharge   Problem: Education: Goal: Ability to make informed decisions regarding treatment will improve Outcome: Adequate for Discharge   Problem: Coping: Goal: Coping ability will improve Outcome: Adequate for Discharge   Problem: Health Behavior/Discharge Planning: Goal: Identification of resources available to assist in meeting health care needs will improve Outcome: Adequate for Discharge   Problem: Medication: Goal: Compliance with prescribed medication regimen will improve Outcome: Adequate for Discharge   Problem: Self-Concept: Goal: Ability to disclose and discuss suicidal ideas will improve Outcome: Adequate for Discharge Goal: Will verbalize positive feelings about self Outcome: Adequate for Discharge   Problem: Education: Goal: Ability to make informed decisions regarding treatment will improve Outcome: Adequate for Discharge   Problem: Coping: Goal: Coping ability will improve Outcome: Adequate for Discharge   Problem: Health Behavior/Discharge Planning: Goal: Identification of resources available to assist in meeting health care needs will improve Outcome: Adequate for Discharge   Problem: Medication: Goal: Compliance with prescribed medication regimen will  improve Outcome: Adequate for Discharge   Problem: Self-Concept: Goal: Ability to disclose and discuss suicidal ideas will improve Outcome: Adequate for Discharge Goal: Will verbalize positive feelings about self Outcome: Adequate for Discharge   Problem: Activity: Goal: Ability to return to normal activity level will improve to the fullest extent possible by discharge Outcome: Adequate for Discharge   Problem: Education: Goal: Knowledge of medication regimen will be met for pain relief regimen by discharge Outcome: Adequate for Discharge Goal: Understanding of ways to prevent infection will improve by discharge Outcome: Adequate for Discharge   Problem: Coping: Goal: Ability to verbalize feelings will improve by discharge Outcome: Adequate for Discharge Goal: Family members realistic understanding of the patients condition will improve by discharge Outcome: Adequate for Discharge   Problem: Fluid Volume: Goal: Maintenance of adequate hydration will improve by discharge Outcome: Adequate for Discharge   Problem: Medication: Goal: Compliance with prescribed medication regimen will improve by discharge Outcome: Adequate for Discharge   Problem: Physical Regulation: Goal: Hemodynamic stability will return to baseline for the patient by discharge Outcome: Adequate for Discharge Goal: Diagnostic test results will improve Outcome: Adequate for Discharge Goal: Will remain free from infection Outcome: Adequate for Discharge   Problem: Respiratory: Goal: Ability to maintain adequate oxygenation and ventilation will improve by discharge Outcome: Adequate for Discharge   Problem: Role Relationship: Goal: Ability to identify and utilize available support systems will improve by discharge Outcome: Adequate for Discharge   Problem: Pain Management: Goal: Satisfaction with pain management regimen will be met by discharge Outcome: Adequate for Discharge   Problem:  Education: Goal: Utilization of techniques to improve thought processes will improve Outcome: Adequate for Discharge Goal: Knowledge of the prescribed therapeutic regimen will improve Outcome: Adequate for Discharge

## 2022-02-01 NOTE — [facility] Counselor (Signed)
CSW met with pt to discuss discharge. Pt endorsed plans to return to her daughter's in order to get her car and her daughter on the autism spectrum prior to returning to [place], [place]. She stated that her daughter will come to pick her up but that she says she will need to hear that pt is being discharged from staff. CSW asked if pt was ok with CSW contacting her daughter. Pt agreed. CSW inquired if pt still wanted to make her own appointment for medication management through [facility] Dr. Kasandra Knudsen. Pt stated that she is going to make her own appointment. CSW agreed. As conversation went on CSW spoke with pt about therapy as something that could help her process through things that are going on in her life. She agreed but stated that because of taking care of her daughter this has to be put on hold. Pt confirmed that she is a tobacco user but decline referral for cessation services ([place]uit[place]). Pt denied any use of substance or need for such services. When asked if there was anything else that was needed from social work she stated that a list of therapists closer to her area would be helpful. CSW agreed to give pt this resource. No other concerns expressed. Contact ended without incident.   Chalmers Guest. Guerry Bruin, MSW, LCSW, LCAS 02/01/2022 1:16 PM

## 2022-02-01 NOTE — Progress Notes (Signed)
  [facility] Adult Case Management Discharge Plan :  Will you be returning to the same living situation after discharge:  Yes,  pt plans to return to her daughter's prior to going back to her town home. At discharge, do you have transportation home?: Yes,  daughter to provide transportation.  Do you have the ability to pay for your medications: Yes,  Medicare Part A and B.  Release of information consent forms completed and in the chart;  Patient's signature needed at discharge.  Patient to Follow up at:  Follow-up Information     [facility] [place]. Schedule an appointment as soon as possible for a visit.   Why: Contact them to schedule your appointment as per stated plans. Thanks! Contact information: [place] [address] [phone]                 Next level of care provider has access to [place] and Suicide Prevention discussed: Yes,  SPE completed with pt.      Has patient been referred to the Quitline?: Patient refused referral  Patient has been referred for addiction treatment: N/A  Shirl Harris, LCSW 02/01/2022, 1:16 PM

## 2022-02-01 NOTE — [facility] Suicide Risk Assessment (Signed)
[facility] Discharge Suicide Risk Assessment   Principal Problem: Overdose of benzodiazepine Discharge Diagnoses: Principal Problem:   Overdose of benzodiazepine Active Problems:   Low back pain   Neck pain   Irritable bowel syndrome   Lumbar spondylosis   MDD (major depressive disorder), recurrent episode, moderate (HCC)   Total Time spent with patient: 30 minutes  Musculoskeletal: Strength & Muscle Tone: within normal limits Gait & Station: normal Patient leans: N/A  Psychiatric Specialty Exam  Presentation  General Appearance:  Bizarre  Eye Contact: Good  Speech: Clear and Coherent  Speech Volume: Normal  Handedness: Right   Mood and Affect  Mood: Irritable; Depressed  Duration of Depression Symptoms: Greater than two weeks  Affect: Depressed; Inappropriate   Thought Process  Thought Processes: Coherent  Descriptions of Associations:Loose  Orientation:Full (Time, Place and Person)  Thought Content:Logical  History of Schizophrenia/Schizoaffective disorder:No  Duration of Psychotic Symptoms:No data recorded Hallucinations:No data recorded Ideas of Reference:None  Suicidal Thoughts:No data recorded Homicidal Thoughts:No data recorded  Sensorium  Memory: Immediate Good; Recent Good; Remote Good  Judgment: Poor  Insight: Poor   Executive Functions  Concentration: Fair  Attention Span: Fair  Recall: [place] of Knowledge: Fair  Language: Good   Psychomotor Activity  Psychomotor Activity:No data recorded  Assets  Assets: Communication Skills; Desire for Improvement; Resilience; Social Support   Sleep  Sleep:No data recorded  Physical Exam: Physical Exam Vitals and nursing note reviewed.  Constitutional:      Appearance: Normal appearance.  HENT:     Head: Normocephalic and atraumatic.     Mouth/Throat:     Pharynx: Oropharynx is clear.  Eyes:     Pupils: Pupils are equal, round, and reactive to light.   Cardiovascular:     Rate and Rhythm: Normal rate and regular rhythm.  Pulmonary:     Effort: Pulmonary effort is normal.     Breath sounds: Normal breath sounds.  Abdominal:     General: Abdomen is flat.     Palpations: Abdomen is soft.  Musculoskeletal:        General: Normal range of motion.     Comments: Patient walks with a walker and reports chronic hip and back pain  Skin:    General: Skin is warm and dry.  Neurological:     General: No focal deficit present.     Mental Status: She is alert. Mental status is at baseline.  Psychiatric:        Attention and Perception: Attention normal.        Mood and Affect: Mood is anxious.        Speech: Speech normal.        Behavior: Behavior is agitated. Behavior is not aggressive.        Thought Content: Thought content normal.        Cognition and Memory: Memory is impaired.        Judgment: Judgment is impulsive.    Review of Systems  Constitutional: Negative.   HENT: Negative.    Eyes: Negative.   Respiratory: Negative.    Cardiovascular: Negative.   Gastrointestinal: Negative.   Musculoskeletal:  Positive for back pain, joint pain, myalgias and neck pain.  Skin: Negative.   Neurological: Negative.   Psychiatric/Behavioral:  Positive for depression. Negative for hallucinations, substance abuse and suicidal ideas. The patient is nervous/anxious and has insomnia.    Blood pressure 113/71, pulse 79, temperature 98.1 F (36.7 C), temperature source Oral, resp. rate 18, height 5'  5" (1.651 m), weight 69 kg, SpO2 99 %. Body mass index is 25.31 kg/m.  Mental Status Per Nursing Assessment::   On Admission:  Suicidal ideation indicated by others  Demographic Factors:  Caucasian  Loss Factors: Financial problems/change in socioeconomic status  Historical Factors: Impulsivity  Risk Reduction Factors:   Living with another person, especially a relative and Positive therapeutic relationship  Continued Clinical Symptoms:   Severe Anxiety and/or Agitation Depression:   Impulsivity  Cognitive Features That Contribute To Risk:  Thought constriction (tunnel vision)    Suicide Risk:  Minimal: No identifiable suicidal ideation.  Patients presenting with no risk factors but with morbid ruminations; may be classified as minimal risk based on the severity of the depressive symptoms    Plan Of Care/Follow-up recommendations:  Other:  Patient has consistently denied any suicidal ideation.  She continues to have chronic symptoms of anxiety and depression and presents as irritable and somewhat argumentative but without any psychosis.  Adamantly denies any thought of self-harm now and agrees to continue her outpatient mental health treatment.  Psychoeducation and supportive counseling completed.  Patient will be discharged back to outpatient care.  Alethia Berthold, MD 02/01/2022, 10:19 AM
